# Patient Record
Sex: Male | Born: 1965 | Race: Black or African American | Hispanic: No | Marital: Single | Smoking: Never smoker
Health system: Southern US, Community
[De-identification: ages and names within clinical notes are randomized; demographics above are authoritative.]

## PROBLEM LIST (undated history)

## (undated) HISTORY — PX: LUNG SURGERY: SHX703

---

## 2009-02-01 ENCOUNTER — Emergency Department (HOSPITAL_COMMUNITY): Admission: EM | Admit: 2009-02-01 | Discharge: 2009-02-01 | Payer: Self-pay | Admitting: Emergency Medicine

## 2009-09-16 ENCOUNTER — Emergency Department (HOSPITAL_COMMUNITY): Admission: EM | Admit: 2009-09-16 | Discharge: 2009-09-16 | Payer: Self-pay | Admitting: Family Medicine

## 2010-07-11 ENCOUNTER — Emergency Department (HOSPITAL_COMMUNITY)
Admission: EM | Admit: 2010-07-11 | Discharge: 2010-07-11 | Disposition: A | Discharge status: Home / Self Care | Payer: Self-pay | Attending: Emergency Medicine | Admitting: Emergency Medicine

## 2010-07-11 DIAGNOSIS — R05 Cough: Secondary | ICD-10-CM | POA: Insufficient documentation

## 2010-07-11 DIAGNOSIS — R110 Nausea: Secondary | ICD-10-CM | POA: Insufficient documentation

## 2010-07-11 DIAGNOSIS — R000 Tachycardia, unspecified: Secondary | ICD-10-CM | POA: Insufficient documentation

## 2010-07-11 DIAGNOSIS — E669 Obesity, unspecified: Secondary | ICD-10-CM | POA: Insufficient documentation

## 2010-07-11 DIAGNOSIS — R059 Cough, unspecified: Secondary | ICD-10-CM | POA: Insufficient documentation

## 2010-07-11 DIAGNOSIS — R630 Anorexia: Secondary | ICD-10-CM | POA: Insufficient documentation

## 2010-07-11 DIAGNOSIS — R5381 Other malaise: Secondary | ICD-10-CM | POA: Insufficient documentation

## 2010-07-11 DIAGNOSIS — J069 Acute upper respiratory infection, unspecified: Secondary | ICD-10-CM | POA: Insufficient documentation

## 2010-07-11 DIAGNOSIS — IMO0001 Reserved for inherently not codable concepts without codable children: Secondary | ICD-10-CM | POA: Insufficient documentation

## 2010-07-11 DIAGNOSIS — J3489 Other specified disorders of nose and nasal sinuses: Secondary | ICD-10-CM | POA: Insufficient documentation

## 2011-05-11 ENCOUNTER — Encounter: Payer: Self-pay | Admitting: Emergency Medicine

## 2011-05-11 ENCOUNTER — Emergency Department (HOSPITAL_COMMUNITY)
Admission: EM | Admit: 2011-05-11 | Discharge: 2011-05-12 | Disposition: A | Discharge status: Home / Self Care | Payer: Self-pay | Attending: Emergency Medicine | Admitting: Emergency Medicine

## 2011-05-11 DIAGNOSIS — R110 Nausea: Secondary | ICD-10-CM | POA: Insufficient documentation

## 2011-05-11 DIAGNOSIS — R197 Diarrhea, unspecified: Secondary | ICD-10-CM | POA: Insufficient documentation

## 2011-05-11 DIAGNOSIS — B349 Viral infection, unspecified: Secondary | ICD-10-CM

## 2011-05-11 DIAGNOSIS — B9789 Other viral agents as the cause of diseases classified elsewhere: Secondary | ICD-10-CM | POA: Insufficient documentation

## 2011-05-11 DIAGNOSIS — R509 Fever, unspecified: Secondary | ICD-10-CM | POA: Insufficient documentation

## 2011-05-11 MED ORDER — ONDANSETRON HCL 4 MG/2ML IJ SOLN
4.0000 mg | Freq: Once | INTRAMUSCULAR | Status: AC
  Administered 2011-05-12: 4 mg via INTRAVENOUS
  Filled 2011-05-11: qty 2

## 2011-05-11 MED ORDER — SODIUM CHLORIDE 0.9 % IV BOLUS (SEPSIS)
1000.0000 mL | Freq: Once | INTRAVENOUS | Status: AC
  Administered 2011-05-12: 1000 mL via INTRAVENOUS

## 2011-05-11 MED ORDER — ACETAMINOPHEN 325 MG PO TABS
650.0000 mg | ORAL_TABLET | Freq: Once | ORAL | Status: AC
  Administered 2011-05-11: 650 mg via ORAL
  Filled 2011-05-11: qty 2

## 2011-05-11 NOTE — ED Notes (Signed)
PT. REPORTS DIARRHEA WITH NAUSEA ONSET THIS MORNING , BODY ACHES WITH CHILLS .

## 2011-05-12 LAB — POCT I-STAT, CHEM 8
Creatinine, Ser: 1.2 mg/dL (ref 0.50–1.35)
Glucose, Bld: 107 mg/dL — ABNORMAL HIGH (ref 70–99)
Hemoglobin: 13.6 g/dL (ref 13.0–17.0)
Potassium: 3.6 mEq/L (ref 3.5–5.1)
TCO2: 24 mmol/L (ref 0–100)

## 2011-05-12 LAB — CBC
HCT: 37.7 % — ABNORMAL LOW (ref 39.0–52.0)
Hemoglobin: 12.9 g/dL — ABNORMAL LOW (ref 13.0–17.0)
MCH: 27.6 pg (ref 26.0–34.0)
MCHC: 34.2 g/dL (ref 30.0–36.0)

## 2011-05-12 MED ORDER — ONDANSETRON HCL 4 MG PO TABS: 4.0000 mg | ORAL_TABLET | Freq: Four times a day (QID) | ORAL | Status: AC

## 2011-05-12 NOTE — ED Notes (Signed)
Pt tolerated ginger ale, states he feels better, no needs at this time. States he is ready to be discharged

## 2011-05-12 NOTE — ED Provider Notes (Signed)
History     CSN: [id number]  Arrival date & time 05/11/11  2040   First MD Initiated Contact with Patient 05/11/11 2348      Chief Complaint  Patient presents with  . Diarrhea    (Consider location/radiation/quality/duration/timing/severity/associated sxs/prior treatment) Patient is a 46 y.o. male presenting with diarrhea. The history is provided by the patient.  Diarrhea The primary symptoms include fever and diarrhea. Primary symptoms do not include abdominal pain, dysuria or rash. The illness began yesterday. The onset was gradual. The problem has been gradually worsening.  The illness does not include chills, bloating, constipation or back pain. Associated medical issues do not include inflammatory bowel disease, GERD, gallstones, alcohol abuse, PUD, irritable bowel syndrome or diverticulitis. Risk factors: Recent travel, antibiotics or known sick contacts.   Moderate severity. No pain or radiation. No known aggravating or alleviating factors. 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  No sick contacts at home or in the workplace. He denies getting a flu shot this season. No blood in stools. No vomiting. He does have some associated nausea.  History reviewed. No pertinent past medical history.  Past Surgical History  Procedure Date  . Lung surgery     No family history on file.  History  Substance Use Topics  . Smoking status: Never Smoker   . Smokeless tobacco: Not on file  . Alcohol Use: No      Review of Systems  Constitutional: Positive for fever. Negative for chills.  HENT: Negative for neck pain and neck stiffness.   Eyes: Negative for pain.  Respiratory: Negative for shortness of breath.   Cardiovascular: Negative for chest pain.  Gastrointestinal: Positive for diarrhea. Negative for abdominal pain, constipation and bloating.  Genitourinary: Negative for dysuria.  Musculoskeletal: Negative for back pain.  Skin: Negative for rash.  Neurological: Negative for  headaches.  All other systems reviewed and are negative.    Allergies  Review of patient's allergies indicates no known allergies.  Home Medications   Current Outpatient Rx  Name Route Sig Dispense Refill  . TYLENOL ARTHRITIS PAIN PO Oral Take 1 tablet by mouth every 4 (four) hours as needed. For pain     . ADULT MULTIVITAMIN W/MINERALS CH Oral Take 1 tablet by mouth daily.        BP 141/79  Pulse 101  Temp(Src) 100.8 F (38.2 C) (Oral)  Resp 16  SpO2 95%  Physical Exam  Constitutional: He is oriented to person, place, and time. He appears well-developed and well-nourished.  HENT:  Head: Normocephalic and atraumatic.       Mildly dry mucous membranes  Eyes: Conjunctivae and EOM are normal. Pupils are equal, round, and reactive to light.  Neck: Trachea normal. Neck supple. No thyromegaly present.  Cardiovascular: Normal rate, regular rhythm, S1 normal, S2 normal and normal pulses.     No systolic murmur is present   No diastolic murmur is present  Pulses:      Radial pulses are 2+ on the right side, and 2+ on the left side.  Pulmonary/Chest: Effort normal and breath sounds normal. He has no wheezes. He has no rhonchi. He has no rales. He exhibits no tenderness.  Abdominal: Soft. Normal appearance and bowel sounds are normal. There is no tenderness. There is no CVA tenderness and negative Murphy's sign.  Musculoskeletal:       BLE:s Calves nontender, no cords or erythema, negative Homans sign  Neurological: He is alert and oriented to person, place, and  time. He has normal strength. No cranial nerve deficit or sensory deficit. GCS eye subscore is 4. GCS verbal subscore is 5. GCS motor subscore is 6.  Skin: Skin is warm. No rash noted. He is not diaphoretic.       Increased warmth to touch  Psychiatric: His speech is normal.       Cooperative and appropriate    ED Course  Procedures (including critical care time)  Labs Reviewed  CBC - Abnormal; Notable for the  following:    Hemoglobin 12.9 (*)    HCT 37.7 (*)    All other components within normal limits  POCT I-STAT, CHEM 8 - Abnormal; Notable for the following:    Glucose, Bld 107 (*)    Calcium, Ion 1.11 (*)    All other components within normal limits  I-STAT, CHEM 8   IV Zofran. IV fluids. Tolerates ginger ale without distress   MDM   Fever and diarrhea consistent with viral syndrome. Labs obtained and reviewed as above. Tylenol for fever. Condition improving after IV fluids and medications as above. Patient stable for discharge home and outpatient followup as needed.        Sunnie Nielsen, MD 05/12/11 [phone]

## 2014-08-21 ENCOUNTER — Emergency Department (HOSPITAL_COMMUNITY): Payer: Self-pay

## 2014-08-21 ENCOUNTER — Encounter (HOSPITAL_COMMUNITY): Payer: Self-pay | Admitting: Emergency Medicine

## 2014-08-21 ENCOUNTER — Emergency Department (HOSPITAL_COMMUNITY)
Admission: EM | Admit: 2014-08-21 | Discharge: 2014-08-21 | Disposition: A | Discharge status: Home / Self Care | Payer: Self-pay | Attending: Emergency Medicine | Admitting: Emergency Medicine

## 2014-08-21 DIAGNOSIS — J159 Unspecified bacterial pneumonia: Secondary | ICD-10-CM | POA: Insufficient documentation

## 2014-08-21 DIAGNOSIS — R0981 Nasal congestion: Secondary | ICD-10-CM

## 2014-08-21 DIAGNOSIS — J189 Pneumonia, unspecified organism: Secondary | ICD-10-CM

## 2014-08-21 LAB — CBC WITH DIFFERENTIAL/PLATELET
BASOS PCT: 0 % (ref 0–1)
Basophils Absolute: 0 10*3/uL (ref 0.0–0.1)
EOS ABS: 0.4 10*3/uL (ref 0.0–0.7)
EOS PCT: 6 % — AB (ref 0–5)
HCT: 37.7 % — ABNORMAL LOW (ref 39.0–52.0)
HEMOGLOBIN: 12.6 g/dL — AB (ref 13.0–17.0)
LYMPHS ABS: 1 10*3/uL (ref 0.7–4.0)
Lymphocytes Relative: 17 % (ref 12–46)
MCH: 26.6 pg (ref 26.0–34.0)
MCHC: 33.4 g/dL (ref 30.0–36.0)
MCV: 79.7 fL (ref 78.0–100.0)
MONOS PCT: 8 % (ref 3–12)
Monocytes Absolute: 0.5 10*3/uL (ref 0.1–1.0)
NEUTROS PCT: 69 % (ref 43–77)
Neutro Abs: 4 10*3/uL (ref 1.7–7.7)
Platelets: 244 10*3/uL (ref 150–400)
RBC: 4.73 MIL/uL (ref 4.22–5.81)
RDW: 12.7 % (ref 11.5–15.5)
WBC: 5.9 10*3/uL (ref 4.0–10.5)

## 2014-08-21 LAB — COMPREHENSIVE METABOLIC PANEL
ALT: 31 U/L (ref 0–53)
AST: 35 U/L (ref 0–37)
Albumin: 3.8 g/dL (ref 3.5–5.2)
Alkaline Phosphatase: 67 U/L (ref 39–117)
Anion gap: 9 (ref 5–15)
BILIRUBIN TOTAL: 1.2 mg/dL (ref 0.3–1.2)
BUN: 11 mg/dL (ref 6–23)
CHLORIDE: 104 mmol/L (ref 96–112)
CO2: 25 mmol/L (ref 19–32)
Calcium: 9 mg/dL (ref 8.4–10.5)
Creatinine, Ser: 1.1 mg/dL (ref 0.50–1.35)
GFR, EST AFRICAN AMERICAN: 89 mL/min — AB (ref >90)
GFR, EST NON AFRICAN AMERICAN: 77 mL/min — AB (ref >90)
GLUCOSE: 102 mg/dL — AB (ref 70–99)
Potassium: 4.3 mmol/L (ref 3.5–5.1)
SODIUM: 138 mmol/L (ref 135–145)
Total Protein: 8.1 g/dL (ref 6.0–8.3)

## 2014-08-21 LAB — BRAIN NATRIURETIC PEPTIDE: B Natriuretic Peptide: 27.9 pg/mL (ref 0.0–100.0)

## 2014-08-21 LAB — I-STAT TROPONIN, ED: Troponin i, poc: 0 ng/mL (ref 0.00–0.08)

## 2014-08-21 MED ORDER — AMOXICILLIN-POT CLAVULANATE 875-125 MG PO TABS: 1.0000 | ORAL_TABLET | Freq: Two times a day (BID) | ORAL | Status: DC

## 2014-08-21 MED ORDER — FLUTICASONE PROPIONATE 50 MCG/ACT NA SUSP: 2.0000 | Freq: Every day | NASAL | Status: DC

## 2014-08-21 MED ORDER — LORATADINE 10 MG PO TABS: 10.0000 mg | ORAL_TABLET | Freq: Every day | ORAL | Status: DC | PRN

## 2014-08-21 MED ORDER — LEVOFLOXACIN 500 MG PO TABS
500.0000 mg | ORAL_TABLET | Freq: Once | ORAL | Status: DC
  Filled 2014-08-21: qty 1

## 2014-08-21 MED ORDER — AMOXICILLIN-POT CLAVULANATE 875-125 MG PO TABS
1.0000 | ORAL_TABLET | Freq: Once | ORAL | Status: AC
  Administered 2014-08-21: 1 via ORAL
  Filled 2014-08-21: qty 1

## 2014-08-21 NOTE — Discharge Instructions (Signed)
Try claritin daily in addition to benadryl as needed.   Try flonase daily to both nose.  Take augmentin twice daily for a week.   Stay hydrated.   Follow up with your doctor.   Return to ER if you have worse congestion, fever, chest pain.

## 2014-08-21 NOTE — ED Provider Notes (Signed)
MSE was initiated and I personally evaluated the patient and placed orders (if any) at  7:55 AM on August 21, 2014.  Eric Medina with past medical history of lung surgery presenting to the ED with nasal congestion that has been ongoing for approximately 2 days. Patient reports he feels mild pressure within his maxillary sinuses and feels congested. Patient reported that he has been having a mild cough only at night starting yesterday with a white phlegm. Reported that for the past 3-4 days he's been experiencing intermittent chest pain localized left-sided the chest described as a sharp pain without radiation. Reported that the pain is not related with motion. Denied history of diabetes or hypertension. Denied history of cigarette use. Reported that he's been using [facility] that has been helping his congestion, but not fully. Patient reported that mother has history of hypertension and diabetes as well as numerous heart conditions. Patient reports that he does not follow-up with primary care provider and has not followed up in many years. Denied fever, chills, neck pain, neck stiffness, dizziness, shortness of breath and difficulty breathing, hemoptysis, numbness, tingling, blurred vision, sudden loss of vision, weakness, headache, sore throat, difficulty swallowing, bump pain, travel, leg swelling, nausea, vomiting.  Alert and oriented. GCS 15. Heart rate and rhythm normal. Radial pulses 2+ bilaterally. Cap refill less than 3 seconds. Lungs clear to auscultation to upper and lower lobes bilaterally. Patient is able to speak in full sentences without difficulty, negative use of accessory muscles, negative stridor, negative abdominal retractions. Negative pain upon palpation to the chest wall. Neck supple full range of motion. Negative nuchal rigidity, cervical lymphadenopathy or meningeal signs noted on examination. Negative facial swelling.  Tenderness upon palpation to maxillary sinuses. PERRLA with EOMs intact. Ears noted to have mild congestion with negative findings of infection.  Patient is a 49 year old Medina that does not follow-up with primary care provider. Strong family history of cardiac issues. Patient is pressing chest pain has been ongoing intermittently for 3-4 days. Patient will need further workup. Patient moved to main ED for further assessment. Labs and imaging orders have been placed.  The patient appears stable so that the remainder of the MSE may be completed by another provider.  [name], PA-C 08/21/14 0759  [name], MD 08/21/14 [phone]

## 2014-08-21 NOTE — ED Notes (Signed)
Patient states started having nasal congestion x 2 days ago.  Patient states took an "allergy relief from [facility]".  Patient states it did help, but wanted to come check it out.   Patient states not blowing anything out, but feels congestion.  Patient states he does have a productive cough, but "only cough once an hour or so".

## 2014-08-21 NOTE — ED Notes (Signed)
No mention of chest pain to RN, but mentioned L chest paint to PA during examination.

## 2014-08-21 NOTE — ED Notes (Signed)
NAD at this time. Pt ambulated independently to exit with this RN.

## 2014-08-21 NOTE — ED Provider Notes (Signed)
CSN: [id number]     Arrival date & time 08/21/14  [id number] History   First MD Initiated Contact with Patient 08/21/14 [phone]     Chief Complaint  Patient presents with  . Nasal Congestion  . Chest Pain     (Consider location/radiation/quality/duration/timing/severity/associated sxs/prior Treatment) The history is provided by the patient.  Eric GowdaKenneth L Medina is a 49 y.o. male who presented with congestion, left-sided chest pain, cough. He has been having nasal congestion for the last 3-4 days. Also has some nonproductive cough and some pain when he coughs. Denies any fevers or chills. He states that he gets allergies during this season. Has been taking Benadryl with no relief. Denies any shortness of breath. Denies any recent travels.    History reviewed. No pertinent past medical history. Past Surgical History  Procedure Laterality Date  . Lung surgery     No family history on file. History  Substance Use Topics  . Smoking status: Never Smoker   . Smokeless tobacco: Not on file  . Alcohol Use: No    Review of Systems  HENT: Positive for congestion.   All other systems reviewed and are negative.     Allergies  Review of patient's allergies indicates no known allergies.  Home Medications   Prior to Admission medications   Medication Sig Start Date End Date Taking? Authorizing Provider  diphenhydrAMINE (BENADRYL) 25 MG tablet Take 25 mg by mouth at bedtime as needed for itching or allergies.   Yes Historical Provider, MD  Multiple Vitamin (MULITIVITAMIN WITH MINERALS) TABS Take 1 tablet by mouth daily.     Yes Historical Provider, MD  OVER THE COUNTER MEDICATION Apply 1 application topically daily as needed (itching on arms). OTC itching/rash cream   Yes Historical Provider, MD   BP 136/84 mmHg  Pulse 88  Temp(Src) 98.5 F (36.9 C) (Oral)  Resp 26  Ht 6\' 3"  (1.905 m)  Wt 258 lb (117.028 kg)  BMI 32.25 kg/m2  SpO2 100% Physical Exam  Constitutional: He is oriented to  person, place, and time. He appears well-developed and well-nourished.  HENT:  Head: Normocephalic.  Mouth/Throat: Oropharynx is clear and moist.  No obvious sinus tenderness   Eyes: Conjunctivae are normal. Pupils are equal, round, and reactive to light.  Neck: Normal range of motion. Neck supple.  Cardiovascular: Normal rate, regular rhythm and normal heart sounds.   Pulmonary/Chest: Effort normal and breath sounds normal. No respiratory distress. He has no wheezes. He has no rales. He exhibits no tenderness.  Abdominal: Soft. Bowel sounds are normal. He exhibits no distension. There is no tenderness. There is no rebound.  Musculoskeletal: Normal range of motion. He exhibits no edema or tenderness.  Neurological: He is alert and oriented to person, place, and time. No cranial nerve deficit. Coordination normal.  Skin: Skin is warm and dry.  Psychiatric: He has a normal mood and affect. His behavior is normal. Judgment and thought content normal.  Nursing note and vitals reviewed.   ED Course  Procedures (including critical care time) Labs Review Labs Reviewed  CBC WITH DIFFERENTIAL/PLATELET - Abnormal; Notable for the following:    Hemoglobin 12.6 (*)    HCT 37.7 (*)    Eosinophils Relative 6 (*)    All other components within normal limits  COMPREHENSIVE METABOLIC PANEL - Abnormal; Notable for the following:    Glucose, Bld 102 (*)    GFR calc non Af Amer 77 (*)    GFR calc Af Amer 89 (*)  All other components within normal limits  BRAIN NATRIURETIC PEPTIDE  I-STAT TROPOININ, ED    Imaging Review Dg Chest 2 View  08/21/2014   CLINICAL DATA:  Productive cough and chest congestion. Chest pain with cough.  EXAM: CHEST  2 VIEW  COMPARISON:  02/01/2009  FINDINGS: There is new lateral pleural thickening in the left hemi thorax with retraction of the adjacent lung cord the pleural surface. No appreciable underlying osseous abnormality. Heart size and vascularity are normal. Right  lung is clear. No osseous abnormality.  IMPRESSION: New lateral pleural thickening in the left hemi thorax with lung retraction toward the pleura. Has the patient had interval pneumonia?   Electronically Signed   By: Francene Boyers M.D.   On: 08/21/2014 08:36     EKG Interpretation   Date/Time:  Thursday August 21 2014 08:02:22 EDT Ventricular Rate:  95 PR Interval:  158 QRS Duration: 74 QT Interval:  346 QTC Calculation: 435 R Axis:   53 Text Interpretation:  Sinus rhythm Minimal ST elevation, anterior leads  Baseline wander in lead(s) II III aVL aVF V1 V3 V4 V5 V6 No significant  change since last tracing Confirmed by Titiana Severa  MD, Ilene Witcher ([id number]) on 08/21/2014  8:59:45 AM      MDM   Final diagnoses:  None    Eric Medina is a 49 y.o. male here with sinus congestion, cough. Likely allergies. Will get CXR to r/o pneumonia. I doubt ACS and symptoms for several days so trop x 1 sufficient.   9:29 AM cxr showed pneumonia. WBC nl. Doesn't appear septic. Labs otherwise unremarkable. Doesn't have great insurance. Will try augmentin. Will give flonase and claritin for nasal congestion.    Richardean Canal, MD 08/21/14 0930

## 2015-06-10 ENCOUNTER — Encounter (HOSPITAL_COMMUNITY): Payer: Self-pay | Admitting: Emergency Medicine

## 2015-06-10 ENCOUNTER — Other Ambulatory Visit (HOSPITAL_COMMUNITY)
Admission: RE | Admit: 2015-06-10 | Discharge: 2015-06-10 | Disposition: A | Discharge status: Home / Self Care | Payer: Self-pay | Source: Ambulatory Visit | Attending: Family Medicine | Admitting: Family Medicine

## 2015-06-10 ENCOUNTER — Emergency Department (INDEPENDENT_AMBULATORY_CARE_PROVIDER_SITE_OTHER)
Admission: EM | Admit: 2015-06-10 | Discharge: 2015-06-10 | Disposition: A | Discharge status: Home / Self Care | Payer: Self-pay | Source: Home / Self Care | Attending: Family Medicine | Admitting: Family Medicine

## 2015-06-10 DIAGNOSIS — R369 Urethral discharge, unspecified: Secondary | ICD-10-CM

## 2015-06-10 DIAGNOSIS — N5312 Painful ejaculation: Secondary | ICD-10-CM

## 2015-06-10 DIAGNOSIS — Z113 Encounter for screening for infections with a predominantly sexual mode of transmission: Secondary | ICD-10-CM | POA: Insufficient documentation

## 2015-06-10 MED ORDER — CEFTRIAXONE SODIUM 250 MG IJ SOLR
INTRAMUSCULAR | Status: AC
  Filled 2015-06-10: qty 250

## 2015-06-10 MED ORDER — LIDOCAINE HCL (PF) 1 % IJ SOLN
INTRAMUSCULAR | Status: AC
  Filled 2015-06-10: qty 5

## 2015-06-10 MED ORDER — CEFTRIAXONE SODIUM 250 MG IJ SOLR
250.0000 mg | Freq: Once | INTRAMUSCULAR | Status: AC
  Administered 2015-06-10: 250 mg via INTRAMUSCULAR

## 2015-06-10 MED ORDER — AZITHROMYCIN 250 MG PO TABS
1000.0000 mg | ORAL_TABLET | Freq: Once | ORAL | Status: AC
  Administered 2015-06-10: 1000 mg via ORAL

## 2015-06-10 NOTE — ED Provider Notes (Signed)
CSN: [id number]     Arrival date & time 06/10/15  1828 History   First MD Initiated Contact with Patient 06/10/15 2014     Chief Complaint  Patient presents with  . Urinary Tract Infection   (Consider location/radiation/quality/duration/timing/severity/associated sxs/prior Treatment) HPI Comments: 50 year old male complaining with pain and burning with ejaculation started 2 days ago. Also has a penile discharge. He denies dysuria or testicular pain.  Patient is a 50 y.o. male presenting with urinary tract infection.  Urinary Tract Infection    History reviewed. No pertinent past medical history. Past Surgical History  Procedure Laterality Date  . Lung surgery     No family history on file. Social History  Substance Use Topics  . Smoking status: Never Smoker   . Smokeless tobacco: None  . Alcohol Use: No    Review of Systems  Constitutional: Negative.  Negative for fever.  Genitourinary: Positive for discharge and penile pain. Negative for dysuria, urgency, frequency, decreased urine volume, penile swelling, scrotal swelling, genital sores and testicular pain.  Musculoskeletal: Negative.   Skin: Negative.   Neurological: Negative.     Allergies  Review of patient's allergies indicates no known allergies.  Home Medications   Prior to Admission medications   Medication Sig Start Date End Date Taking? Authorizing Provider  amoxicillin-clavulanate (AUGMENTIN) 875-125 MG per tablet Take 1 tablet by mouth 2 (two) times daily. One po bid x 7 days Patient not taking: Reported on 06/10/2015 08/21/14   Richardean Canal, MD  diphenhydrAMINE (BENADRYL) 25 MG tablet Take 25 mg by mouth at bedtime as needed for itching or allergies.    Historical Provider, MD  fluticasone (FLONASE) 50 MCG/ACT nasal spray Place 2 sprays into both nostrils daily. 08/21/14   Richardean Canal, MD  loratadine (CLARITIN) 10 MG tablet Take 1 tablet (10 mg total) by mouth daily as needed for allergies. 08/21/14   Richardean Canal, MD  Multiple Vitamin (MULITIVITAMIN WITH MINERALS) TABS Take 1 tablet by mouth daily.      Historical Provider, MD  OVER THE COUNTER MEDICATION Apply 1 application topically daily as needed (itching on arms). OTC itching/rash cream    Historical Provider, MD   Meds Ordered and Administered this Visit   Medications  cefTRIAXone (ROCEPHIN) injection 250 mg (not administered)  azithromycin (ZITHROMAX) tablet 1,000 mg (not administered)    BP 146/96 mmHg  Pulse 77  Temp(Src) 98 F (36.7 C) (Oral)  Resp 18  SpO2 100% No data found.   Physical Exam  Constitutional: He appears well-developed and well-nourished. No distress.  Neck: Normal range of motion. Neck supple.  Cardiovascular: Normal rate.   Pulmonary/Chest: Effort normal.  Musculoskeletal: He exhibits no edema.  Neurological: He is alert. No cranial nerve deficit.  Skin: Skin is warm and dry.  Psychiatric: He has a normal mood and affect.  Nursing note and vitals reviewed.   ED Course  Procedures (including critical care time)  Labs Review Labs Reviewed  URINE CYTOLOGY ANCILLARY ONLY    Imaging Review No results found.   Visual Acuity Review  Right Eye Distance:   Left Eye Distance:   Bilateral Distance:    Right Eye Near:   Left Eye Near:    Bilateral Near:         MDM   1. Abnormal penile discharge   2. Pain with ejaculation    Meds ordered this encounter  Medications  . cefTRIAXone (ROCEPHIN) injection 250 mg    Sig:   .  azithromycin (ZITHROMAX) tablet 1,000 mg    Sig:    Urine cytology pending    Hayden Rasmussen, NP 06/10/15 2031

## 2015-06-10 NOTE — ED Notes (Signed)
Patient not ready for discharge, post injection delay 

## 2015-06-10 NOTE — Discharge Instructions (Signed)
Dyspareunia, Male Dyspareunia is pain that is associated with sexual activity. This can affect any part of the genitals or lower abdomen, and there are many possible causes. This condition ranges from mild to severe. Depending on the cause, dyspareunia may get better with treatment, or it may return (recur) over time. CAUSES The cause of this condition is not always known. Possible causes include:  Cancer.  Psychological factors, such as depression, anxiety, or previous traumatic experiences.  Infection in any of the following body parts:  The prostate.  The bladder.  The seminal vesicles.  The skin of the penis or scrotum.  An inflamed bladder (interstitial cystitis).  Gonorrhea.  An inflamed urethra (urethritis).  An inflamed prostate (prostatitis).  Deformities of the penis.  A tight foreskin. RISK FACTORS The following factors may make you more likely to develop this condition:  Having experienced physical or sexual trauma.  Older age. SYMPTOMS The main symptom of this condition is pain in any part of the genitals or lower abdomen during or after sexual activity. This may include pain during sexual arousal, genital stimulation, or ejaculation. Pain may get worse when the genitals are touched in any way, such as when sitting or wearing pants. Pain can range from mild to severe, depending on the cause of the condition. In some cases, symptoms go away with treatment and recur at a later date. DIAGNOSIS This condition may be diagnosed based on:  Your symptoms, including:  Where your pain is located.  When your pain occurs.  Your medical history.  A physical exam. This may include a prostate exam.  Tests, including:  Blood tests.  Ultrasound. This uses sound waves to make a picture of the area that is being tested.  Urine culture. This test involves checking a urine sample for signs of infection.  X-rays.  MRI.  CT scan. You may be referred to a health  care provider who specializes in [facility] (urologist). In some cases, diagnosing the cause of dyspareunia can be difficult. TREATMENT Treatment depends on the cause of your condition and your symptoms. In most cases, you may need to stop sexual activity until your symptoms improve. Treatment may include:  Lubricants.  Medicated skin creams.  Antibiotic medicine to prevent or fight infection.  Medicines that help to relieve pain.  Medicines that treat depression (antidepressants).  Psychological counseling.  Sex therapy.  Surgery. HOME CARE INSTRUCTIONS Lifestyle  Avoid tight clothing and irritating materials around your genital and abdominal area.  Use water-based lubricants as needed. Avoid oil-based lubricants.  Do not use any products that irritate you. This may include certain condoms, spermicides, lubricants, or soaps.  Always practice safe sex. Talk with your health care provider about which form of birth control (contraception) is best for you.  Maintain open communication with your sexual partner. General Instructions  Take over-the-counter and prescription medicines only as told by your health care provider.  If you had tests done, it is your responsibility to get your tests results. Ask your health care provider or the department performing the test when your results will be ready.  Urinate before you engage in sexual activity.  Consider joining a support group.  Keep all follow-up visits as told by your health care provider. This is important. SEEK MEDICAL CARE IF:  You have:  A new rash.  Symptoms that get worse or do not improve with treatment.  A fever.  Pain when you urinate.  Blood in your urine. SEEK IMMEDIATE MEDICAL CARE IF:  You pass out after having sexual intercourse.   This information is not intended to replace advice given to you by your health care provider. Make sure you discuss any questions you have with your health care  provider.   Document Released: 01/14/2015 Document Reviewed: 11/25/2014 Elsevier Interactive Patient Education [employer]

## 2015-06-10 NOTE — ED Notes (Signed)
Patient concerned for infection

## 2015-06-11 LAB — URINE CYTOLOGY ANCILLARY ONLY
Chlamydia: NEGATIVE
NEISSERIA GONORRHEA: NEGATIVE
TRICH (WINDOWPATH): NEGATIVE

## 2015-06-20 ENCOUNTER — Telehealth (HOSPITAL_COMMUNITY): Payer: Self-pay | Admitting: Emergency Medicine

## 2015-06-20 NOTE — ED Notes (Signed)
LM on pt's VM [phone] Need to give lab results from recent visit on 2/1  Per Dr. Dayton Scrape,  Please let patient know that chlamydia/gonorrhea/trichomonas tests were negative  Will try later.

## 2015-06-29 NOTE — ED Notes (Signed)
Called pt and notified of recent lab results from visit 2/1 Pt ID'd properly... Reports feeling better and sx have subsided  Per Dr. Dayton Scrape,  Please let patient know that chlamydia/gonorrhea/trichomonas tests were negative. LM  Adv pt if sx are not getting better to return  Education on safe sex given Pt verb understanding.

## 2015-10-25 IMAGING — DX DG CHEST 2V
2 series · 2 of 2 positions shown · non-contrast
Comparison: 02/01/2009

CLINICAL DATA: Productive cough and chest congestion. Chest pain
with cough.

EXAM:
CHEST  2 VIEW

[chest pa]
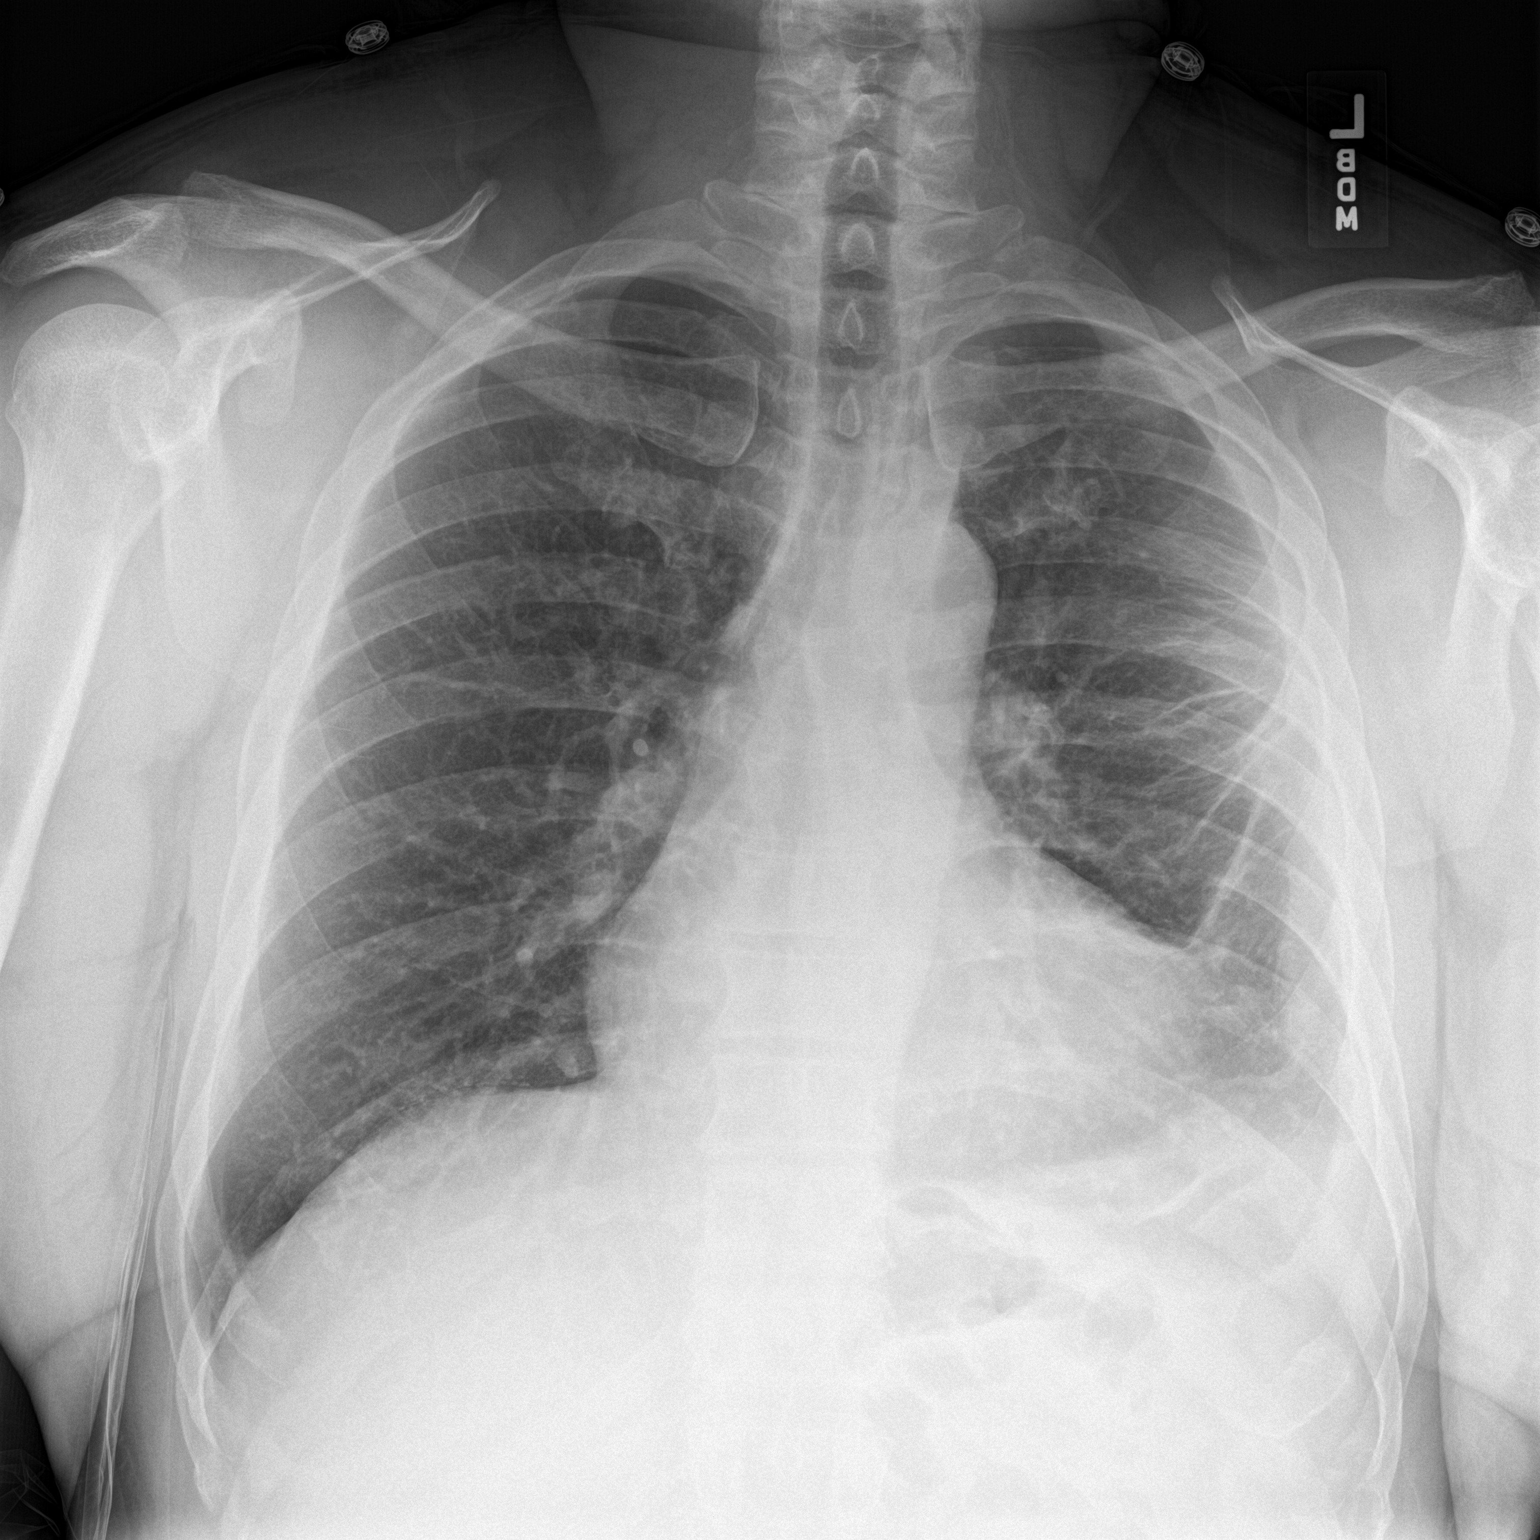

[chest lat]
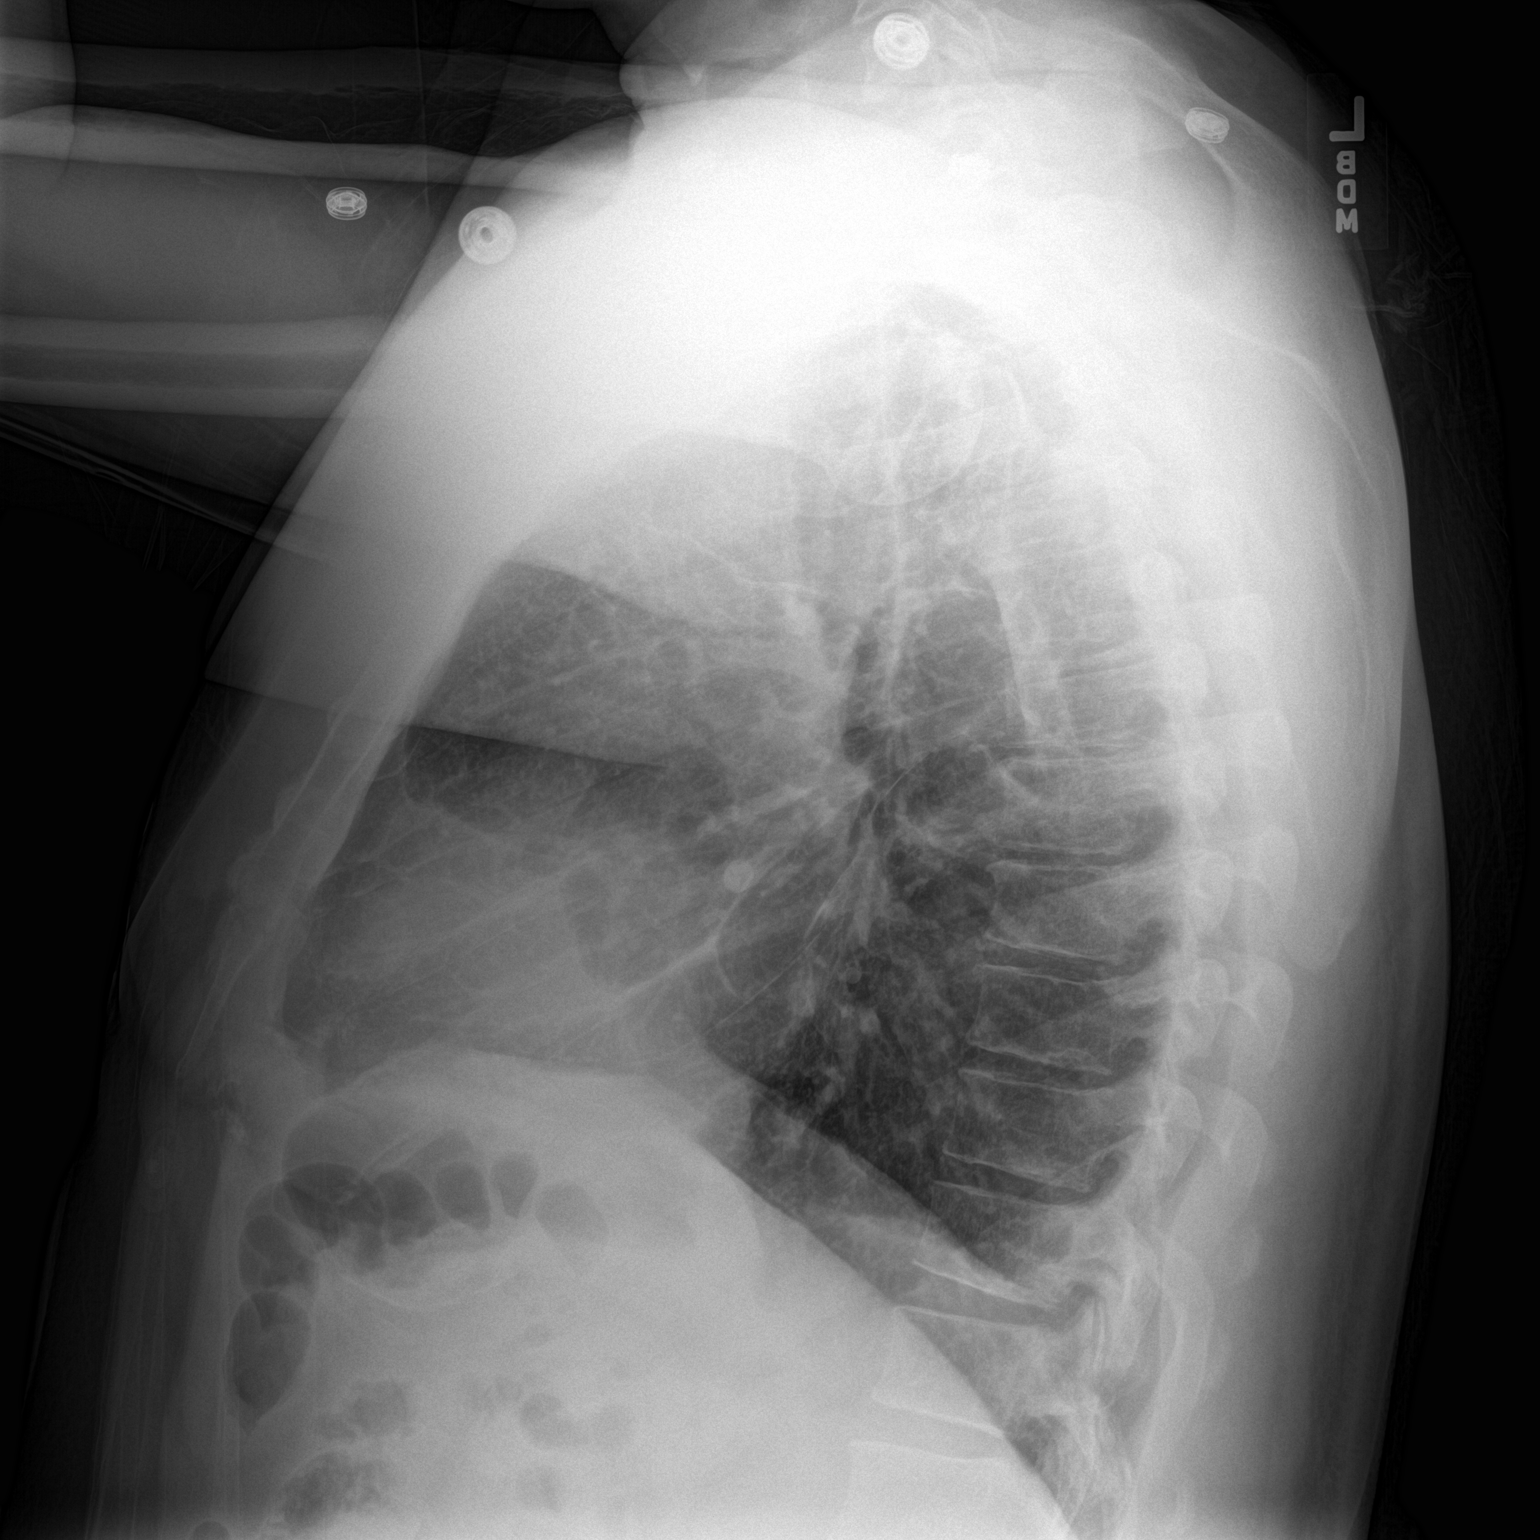

[2 of 2 positions shown; findings below may reference images not displayed]

FINDINGS: There is new lateral pleural thickening in the left hemi thorax with
retraction of the adjacent lung cord the pleural surface. No
appreciable underlying osseous abnormality. Heart size and
vascularity are normal. Right lung is clear. No osseous abnormality.
IMPRESSION: New lateral pleural thickening in the left hemi thorax with lung
retraction toward the pleura. Has the patient had interval
pneumonia?

## 2016-01-01 ENCOUNTER — Encounter (HOSPITAL_COMMUNITY): Payer: Self-pay | Admitting: Emergency Medicine

## 2016-01-01 ENCOUNTER — Ambulatory Visit (INDEPENDENT_AMBULATORY_CARE_PROVIDER_SITE_OTHER): Payer: Self-pay

## 2016-01-01 ENCOUNTER — Ambulatory Visit (HOSPITAL_COMMUNITY)
Admission: EM | Admit: 2016-01-01 | Discharge: 2016-01-01 | Disposition: A | Discharge status: Home / Self Care | Payer: Self-pay | Attending: Family Medicine | Admitting: Family Medicine

## 2016-01-01 DIAGNOSIS — M84443S Pathological fracture, unspecified hand, sequela: Secondary | ICD-10-CM

## 2016-01-01 DIAGNOSIS — M25532 Pain in left wrist: Secondary | ICD-10-CM

## 2016-01-01 MED ORDER — NAPROXEN 500 MG PO TABS: 500.0000 mg | ORAL_TABLET | Freq: Two times a day (BID) | ORAL | 0 refills | Status: DC

## 2016-01-01 NOTE — ED Triage Notes (Signed)
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 Has some swelling as well  A&O x4... NAD

## 2016-01-01 NOTE — ED Provider Notes (Signed)
CSN: [id number]     Arrival date & time 01/01/16  1201 History   First MD Initiated Contact with Patient 01/01/16 1316     Chief Complaint  Patient presents with  . 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   The history is provided by the patient.  Wrist Pain  This is a new problem. The current episode started more than 1 week ago. The problem occurs constantly. The problem has not changed since onset.The symptoms are aggravated by exertion. The symptoms are relieved by rest. He has tried nothing for the symptoms.    History reviewed. No pertinent past medical history. Past Surgical History:  Procedure Laterality Date  . LUNG SURGERY     History reviewed. No pertinent family history. Social History  Substance Use Topics  . Smoking status: Never Smoker  . Smokeless tobacco: Never Used  . Alcohol use No    Review of Systems  Constitutional: Negative.   HENT: Negative.   Eyes: Negative.   Respiratory: Negative.   Cardiovascular: Negative.   Gastrointestinal: Negative.   Endocrine: Negative.   Genitourinary: Negative.   Musculoskeletal: Positive for joint swelling.  Skin: Negative.   Allergic/Immunologic: Negative.   Neurological: Negative.   Hematological: Negative.   Psychiatric/Behavioral: Negative.     Allergies  Review of patient's allergies indicates no known allergies.  Home Medications   Prior to Admission medications   Medication Sig Start Date End Date Taking? Authorizing Provider  amoxicillin-clavulanate (AUGMENTIN) 875-125 MG per tablet Take 1 tablet by mouth 2 (two) times daily. One po bid x 7 days Patient not taking: Reported on 06/10/2015 08/21/14   Charlynne Pander, MD  diphenhydrAMINE (BENADRYL) 25 MG tablet Take 25 mg by  mouth at bedtime as needed for itching or allergies.    Historical Provider, MD  fluticasone (FLONASE) 50 MCG/ACT nasal spray Place 2 sprays into both nostrils daily. 08/21/14   Charlynne Pander, MD  loratadine (CLARITIN) 10 MG tablet Take 1 tablet (10 mg total) by mouth daily as needed for allergies. 08/21/14   Charlynne Pander, MD  Multiple Vitamin (MULITIVITAMIN WITH MINERALS) TABS Take 1 tablet by mouth daily.      Historical Provider, MD  OVER THE COUNTER MEDICATION Apply 1 application topically daily as needed (itching on arms). OTC itching/rash cream    Historical Provider, MD   Meds Ordered and Administered this Visit  Medications - No data to display  BP 116/68   Pulse 84   Temp 98.8 F (37.1 C) (Oral)   Resp 12   SpO2 98%  No data found.   Physical Exam  Constitutional: He appears well-developed and well-nourished.  HENT:  Head: Normocephalic and atraumatic.  Eyes: EOM are normal. Pupils are equal, round, and reactive to light.  Neck: Normal range of motion. Neck supple.  Cardiovascular: Normal rate, regular rhythm and normal heart sounds.   Pulmonary/Chest: Effort normal and breath sounds normal.  Musculoskeletal: He exhibits tenderness.  Left wrist swollen and tender and decreased ROM  Nursing note and vitals reviewed.   Urgent Care Course   Clinical Course    Procedures (including critical care time)  Labs Review Labs Reviewed - No data to display  Imaging Review Dg Wrist Complete Left  Result Date: 01/01/2016 CLINICAL DATA:  Patient states that he was carrying heavy pipe at work and started having pain 5 days ago in his left wrist EXAM: LEFT WRIST - COMPLETE 3+ VIEW COMPARISON:  None. FINDINGS: There is an apparent fracture at the ulnar base of the fifth metacarpal, nondisplaced. This could be a chronic finding, but if it correlates with the site of pain, should be considered likely acute. No other evidence of a fracture.  No bone lesion. The joints are  normally spaced and aligned.  No arthropathic change. Soft tissues are unremarkable. IMPRESSION: 1. Possible acute fracture at the ulnar base of the fifth metacarpal versus a chronic finding. Please correlate clinically. 2. No other abnormality. Electronically Signed   By: Amie Portlandavid  Ormond M.D.   On: 01/01/2016 13:35     Visual Acuity Review  Right Eye Distance:   Left Eye Distance:   Bilateral Distance:    Right Eye Near:   Left Eye Near:    Bilateral Near:         MDM  Left wrist pain -  Left 5th metacarpal fracture- Cock up splint and follow up with Orthopedics next week.  Naprosyn 500mg  one po bid x 10 days.  No use of left upper extremity Until cleared by orthopedics.   Deatra CanterWilliam J Oxford, FNP 01/01/16 [phone]

## 2017-03-06 IMAGING — DX DG WRIST COMPLETE 3+V*L*
4 series · 4 of 4 positions shown · non-contrast
Comparison: None.

CLINICAL DATA: Patient states that he was carrying heavy pipe at
work and started having pain 5 days ago in his left wrist

EXAM:
LEFT WRIST - COMPLETE 3+ VIEW

[wrist pa]
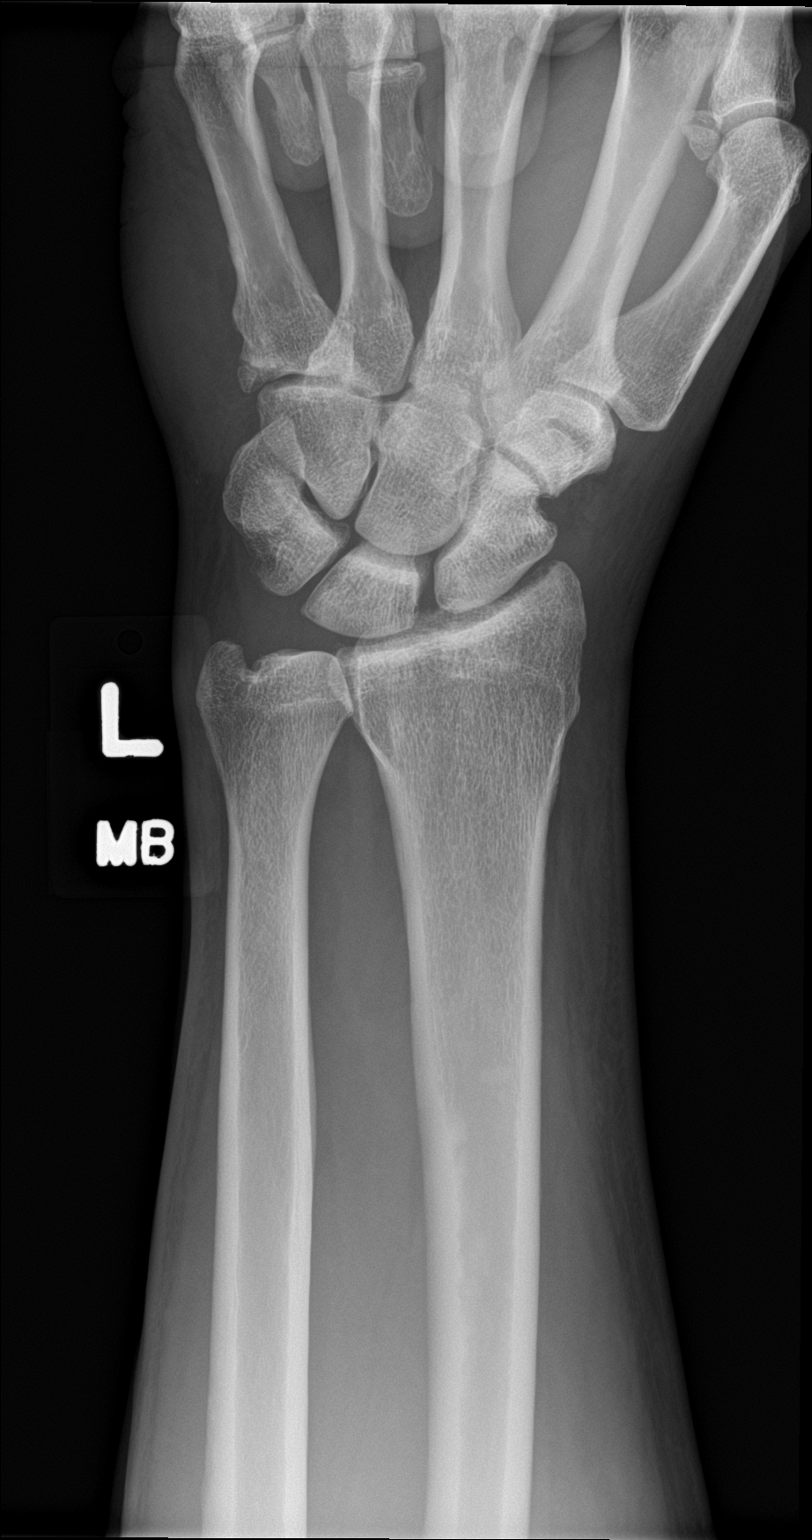

[wrist navicular]
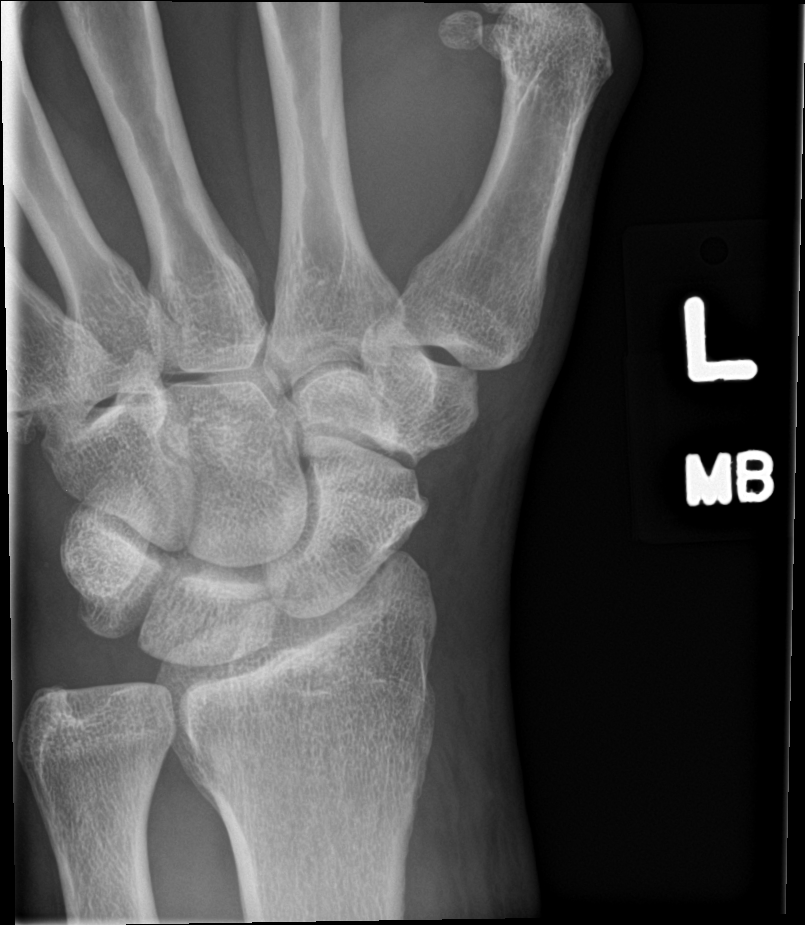

[wrist obl]
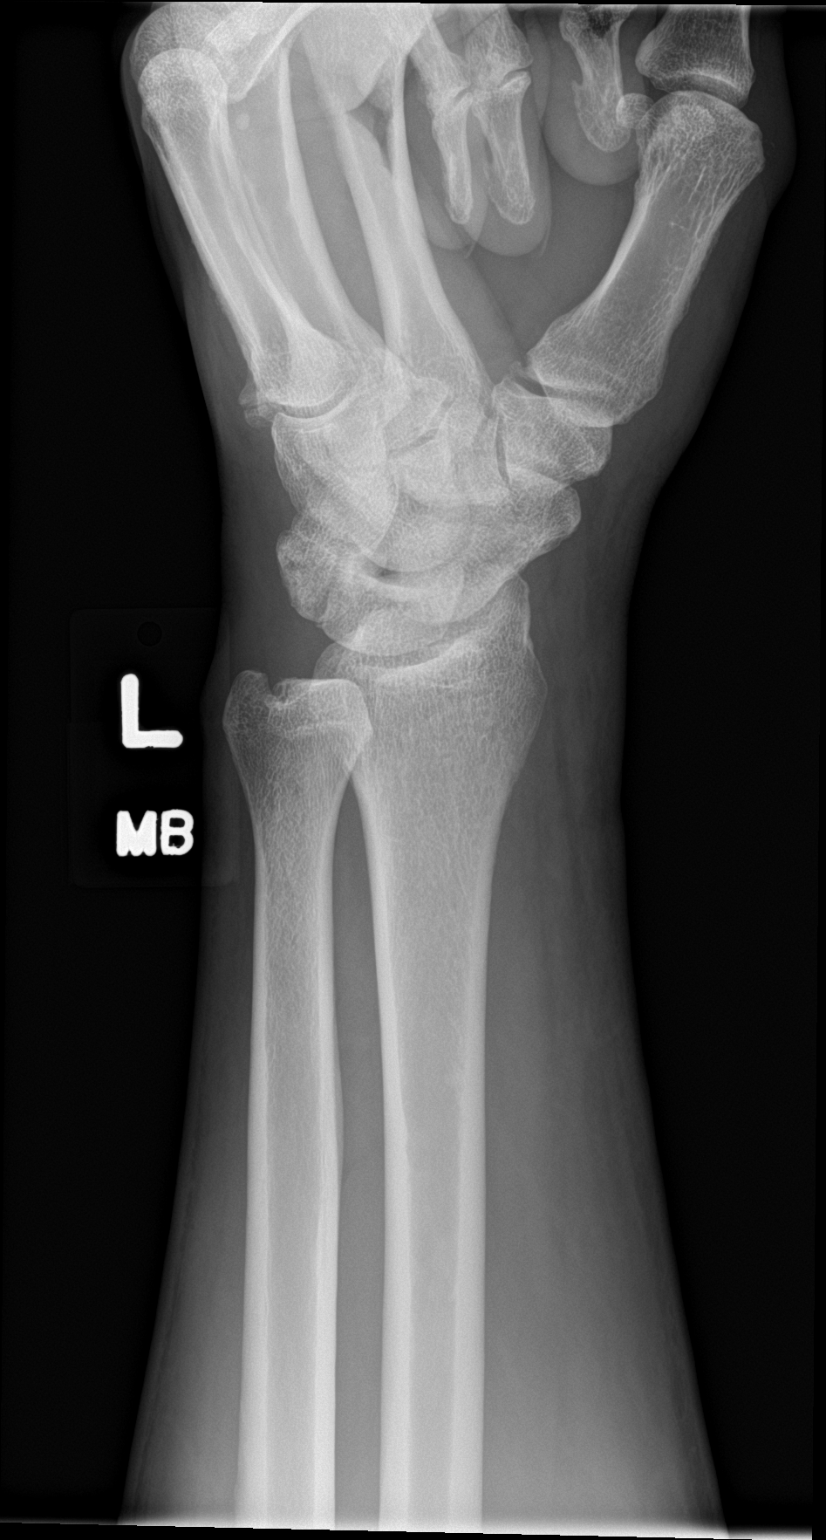

[wrist lat]
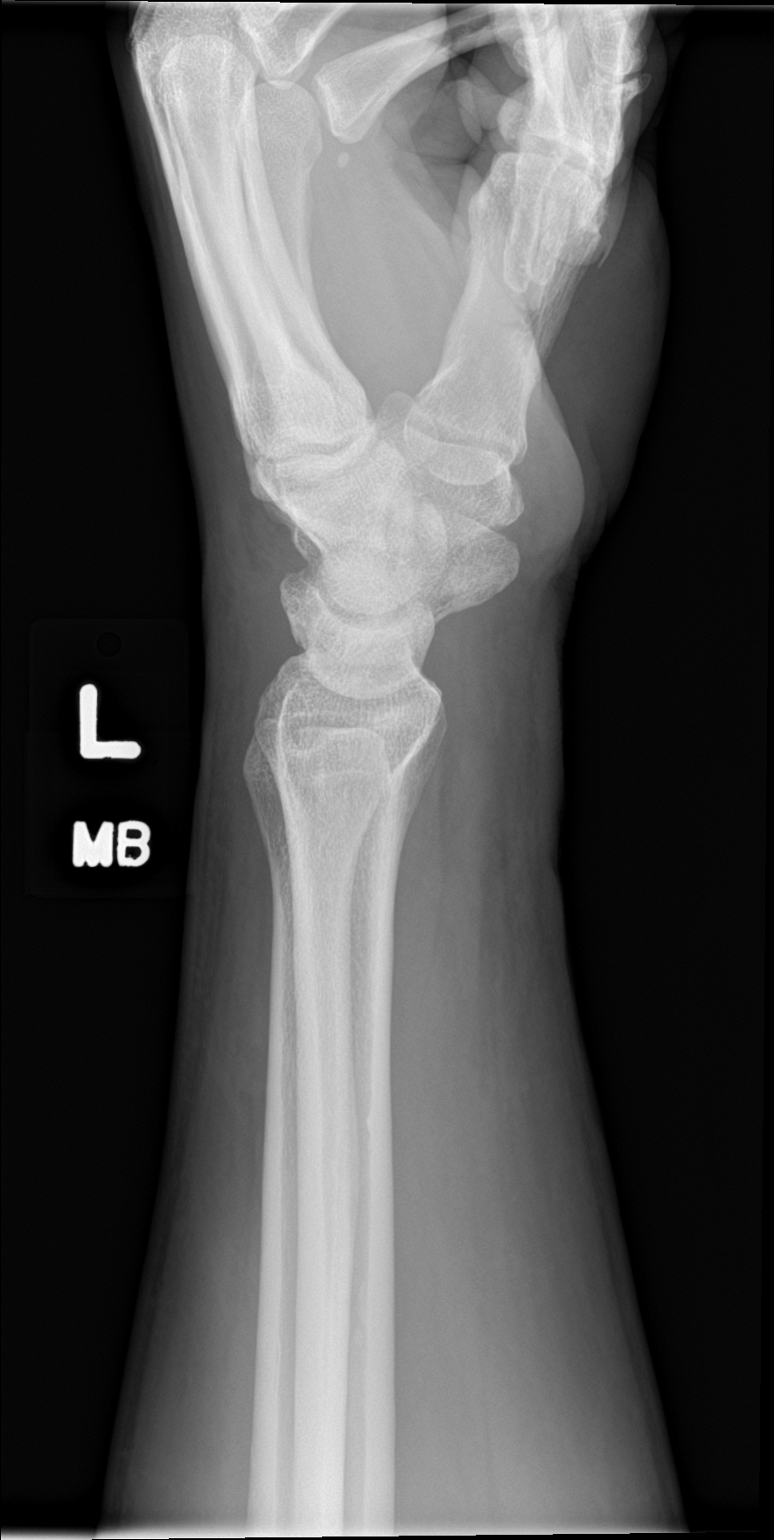

[4 of 4 positions shown; findings below may reference images not displayed]

FINDINGS: There is an apparent fracture at the ulnar base of the fifth
metacarpal, nondisplaced. This could be a chronic finding, but if it
correlates with the site of pain, should be considered likely acute.

No other evidence of a fracture.  No bone lesion.

The joints are normally spaced and aligned.  No arthropathic change.

Soft tissues are unremarkable.
IMPRESSION: 1. Possible acute fracture at the ulnar base of the fifth metacarpal
versus a chronic finding. Please correlate clinically.
2. No other abnormality.

## 2017-04-06 ENCOUNTER — Ambulatory Visit (HOSPITAL_COMMUNITY)
Admission: EM | Admit: 2017-04-06 | Discharge: 2017-04-06 | Disposition: A | Discharge status: Home / Self Care | Payer: Self-pay | Attending: Emergency Medicine | Admitting: Emergency Medicine

## 2017-04-06 ENCOUNTER — Encounter (HOSPITAL_COMMUNITY): Payer: Self-pay | Admitting: *Deleted

## 2017-04-06 DIAGNOSIS — R05 Cough: Secondary | ICD-10-CM

## 2017-04-06 DIAGNOSIS — J069 Acute upper respiratory infection, unspecified: Secondary | ICD-10-CM

## 2017-04-06 DIAGNOSIS — Z202 Contact with and (suspected) exposure to infections with a predominantly sexual mode of transmission: Secondary | ICD-10-CM | POA: Insufficient documentation

## 2017-04-06 DIAGNOSIS — Z711 Person with feared health complaint in whom no diagnosis is made: Secondary | ICD-10-CM

## 2017-04-06 DIAGNOSIS — Z113 Encounter for screening for infections with a predominantly sexual mode of transmission: Secondary | ICD-10-CM

## 2017-04-06 DIAGNOSIS — J029 Acute pharyngitis, unspecified: Secondary | ICD-10-CM | POA: Insufficient documentation

## 2017-04-06 DIAGNOSIS — Z79899 Other long term (current) drug therapy: Secondary | ICD-10-CM | POA: Insufficient documentation

## 2017-04-06 LAB — POCT URINALYSIS DIP (DEVICE)
BILIRUBIN URINE: NEGATIVE
Glucose, UA: NEGATIVE mg/dL
Hgb urine dipstick: NEGATIVE
Ketones, ur: NEGATIVE mg/dL
Leukocytes, UA: NEGATIVE
NITRITE: NEGATIVE
PH: 7 (ref 5.0–8.0)
Protein, ur: NEGATIVE mg/dL
Specific Gravity, Urine: 1.02 (ref 1.005–1.030)
Urobilinogen, UA: 0.2 mg/dL (ref 0.0–1.0)

## 2017-04-06 MED ORDER — CEFTRIAXONE SODIUM 250 MG IJ SOLR
INTRAMUSCULAR | Status: AC
  Filled 2017-04-06: qty 250

## 2017-04-06 MED ORDER — AZITHROMYCIN 250 MG PO TABS
1000.0000 mg | ORAL_TABLET | Freq: Once | ORAL | Status: AC
  Administered 2017-04-06: 1000 mg via ORAL

## 2017-04-06 MED ORDER — CEFTRIAXONE SODIUM 250 MG IJ SOLR
250.0000 mg | Freq: Once | INTRAMUSCULAR | Status: AC
  Administered 2017-04-06: 250 mg via INTRAMUSCULAR

## 2017-04-06 MED ORDER — AZITHROMYCIN 250 MG PO TABS
ORAL_TABLET | ORAL | Status: AC
  Filled 2017-04-06: qty 4

## 2017-04-06 NOTE — ED Notes (Signed)
Education completed regarding blood pressure reading today. Patient verbalized understanding the importance of monitoring blood pressure over the next several weeks. Patient will make PCP appointment if reading remains over 140.

## 2017-04-06 NOTE — ED Provider Notes (Signed)
[facility]    CSN: [id number] Arrival date & time: 04/06/17  1001     History   Chief Complaint Chief Complaint  Patient presents with  . Sore Throat  . Cough  . Exposure to STD    HPI Eric Medina is a 51 y.o. male.   Eric Medina presents with complaints of dry sore throat which started approximately 2-3 days ago. Feels worse at night or if take a deep breath. States he has some post nasal drip which causes small cough. Denies runny nose, fever, ear pain, skin rash. Nausea episode 2 days ago, without vomiting or diarrhea. Denies abdominal pain or back pain. Took allergy medication last night which helped him sleep. Had taken dayquil and nyquil as well which helped some. Without shortness of breat. Does not have a primary care provider.  Also has concerns of std as he states he occasionally has burning with urination, and occasionally with burning during intercourse. Denies penile discharge. Has two sexual partners. Does not use condoms. Denies any specific known exposure to std.    ROS per HPI.       History reviewed. No pertinent past medical history.  There are no active problems to display for this patient.   Past Surgical History:  Procedure Laterality Date  . LUNG SURGERY         Home Medications    Prior to Admission medications   Medication Sig Start Date End Date Taking? Authorizing Provider  loratadine (CLARITIN) 10 MG tablet Take 1 tablet (10 mg total) by mouth daily as needed for allergies. 08/21/14  Yes Charlynne PanderYao, David Hsienta, MD  Multiple Vitamin (MULITIVITAMIN WITH MINERALS) TABS Take 1 tablet by mouth daily.     Yes [provider]  diphenhydrAMINE (BENADRYL) 25 MG tablet Take 25 mg by mouth at bedtime as needed for itching or allergies.    [provider]  fluticasone (FLONASE) 50 MCG/ACT nasal spray Place 2 sprays into both nostrils daily. 08/21/14   Charlynne PanderYao, David Hsienta, MD  naproxen (NAPROSYN) 500 MG tablet Take 1 tablet  (500 mg total) by mouth 2 (two) times daily with a meal. 01/01/16   Deatra Canterxford, William J, FNP  OVER THE COUNTER MEDICATION Apply 1 application topically daily as needed (itching on arms). OTC itching/rash cream    [provider]    Family History Family History  Problem Relation Age of Onset  . Heart failure Mother   . Diabetes Father   . Stroke Father     Social History Social History   Tobacco Use  . Smoking status: Never Smoker  . Smokeless tobacco: Never Used  Substance Use Topics  . Alcohol use: No  . Drug use: No     Allergies   Patient has no known allergies.   Review of Systems Review of Systems   Physical Exam Triage Vital Signs ED Triage Vitals [04/06/17 1043]  Enc Vitals Group     BP (!) 152/94     Pulse Rate 78     Resp 17     Temp 98.4 F (36.9 C)     Temp Source Oral     SpO2 100 %     Weight      Height      Head Circumference      Peak Flow      Pain Score      Pain Loc      Pain Edu?      Excl. in GC?  No data found.  Updated Vital Signs BP (!) 152/94 (BP Location: Left Arm)   Pulse 78   Temp 98.4 F (36.9 C) (Oral)   Resp 17   SpO2 100%   Visual Acuity Right Eye Distance:   Left Eye Distance:   Bilateral Distance:    Right Eye Near:   Left Eye Near:    Bilateral Near:     Physical Exam  Constitutional: He is oriented to person, place, and time. He appears well-developed and well-nourished.  HENT:  Head: Normocephalic and atraumatic.  Right Ear: Tympanic membrane, external ear and ear canal normal.  Left Ear: External ear and ear canal normal. Tympanic membrane is injected.  Nose: Nose normal. Right sinus exhibits no maxillary sinus tenderness and no frontal sinus tenderness. Left sinus exhibits no maxillary sinus tenderness and no frontal sinus tenderness.  Mouth/Throat: Uvula is midline and mucous membranes are normal. Posterior oropharyngeal erythema present.  Eyes: Conjunctivae are normal. Pupils are equal,  round, and reactive to light.  Neck: Normal range of motion.  Cardiovascular: Normal rate and regular rhythm.  Pulmonary/Chest: Effort normal and breath sounds normal.  Genitourinary:  Genitourinary Comments: Denies penile lesions or discharge, exam deferred today  Lymphadenopathy:       Head (left side): Submandibular adenopathy present.    He has no cervical adenopathy.  Left firm nontender submandibular node noted; encouraged patient to monitor and have rechecked with pcp in the next 1-2 weeks once uri symptoms have resolved  Neurological: He is alert and oriented to person, place, and time.  Skin: Skin is warm and dry.  Vitals reviewed.    UC Treatments / Results  Labs (all labs ordered are listed, but only abnormal results are displayed) Labs Reviewed  POCT URINALYSIS DIP (DEVICE)  URINE CYTOLOGY ANCILLARY ONLY    EKG  EKG Interpretation None       Radiology No results found.  Procedures Procedures (including critical care time)  Medications Ordered in UC Medications  azithromycin (ZITHROMAX) tablet 1,000 mg (1,000 mg Oral Given 04/06/17 1111)  cefTRIAXone (ROCEPHIN) injection 250 mg (250 mg Intramuscular Given 04/06/17 1111)     Initial Impression / Assessment and Plan / UC Course  I have reviewed the triage vital signs and the nursing notes.  Pertinent labs & imaging results that were available during my care of the patient were reviewed by me and considered in my medical decision making (see chart for details).     URI symptoms consistent with viral illness. Continue with supportive cares. Patient requests treatment for gonorrhea / chlamydia at this time, provided prior to departure. Negative UA at this time.  Will notify of any positive findings and if any changes to treatment are needed. Recommended establish with a PCP for bp recheck and if symptoms persist or do not improve. Patient verbalized understanding and agreeable to plan.      Final  Clinical Impressions(s) / UC Diagnoses   Final diagnoses:  Viral upper respiratory tract infection  Concern about STD in male without diagnosis    ED Discharge Orders    None       Controlled Substance Prescriptions [place] Controlled Substance Registry consulted? Not Applicable   Georgetta HaberBurky, [place] Marney B, NP 04/06/17 1116    Linus MakoBurky, Lauralye Kinn B, NP 04/06/17 1124

## 2017-04-06 NOTE — ED Triage Notes (Signed)
Patient reports sore throat and cough x 2-3 days. Patient has been taking OTC meds to help with symptoms.   Patient also would like tested for STDs.

## 2017-04-06 NOTE — Discharge Instructions (Signed)
Will notify of any positive findings and if any changes to treatment are needed.  Continue with over the counter treatments for upper respiratory tract symptoms. Push fluids to ensure adequate hydration and keep secretions thin.  Tylenol and/or ibuprofen as needed for pain or fevers.  Allergy medication as needed.  Please follow up with a primary care provider to have your BP rechecked in the next week.

## 2017-04-07 LAB — URINE CYTOLOGY ANCILLARY ONLY
CHLAMYDIA, DNA PROBE: NEGATIVE
Neisseria Gonorrhea: NEGATIVE

## 2020-01-03 ENCOUNTER — Ambulatory Visit (HOSPITAL_COMMUNITY)
Admission: EM | Admit: 2020-01-03 | Discharge: 2020-01-03 | Disposition: A | Discharge status: Home / Self Care | Payer: Self-pay | Attending: Emergency Medicine | Admitting: Emergency Medicine

## 2020-01-03 ENCOUNTER — Encounter (HOSPITAL_COMMUNITY): Payer: Self-pay

## 2020-01-03 ENCOUNTER — Other Ambulatory Visit: Payer: Self-pay

## 2020-01-03 DIAGNOSIS — B356 Tinea cruris: Secondary | ICD-10-CM | POA: Insufficient documentation

## 2020-01-03 DIAGNOSIS — N489 Disorder of penis, unspecified: Secondary | ICD-10-CM | POA: Insufficient documentation

## 2020-01-03 MED ORDER — CLOTRIMAZOLE-BETAMETHASONE 1-0.05 % EX CREA: TOPICAL_CREAM | CUTANEOUS | 0 refills | Status: DC

## 2020-01-03 NOTE — Discharge Instructions (Signed)
Treat the area of redness to your low abdomen with provided cream. Keep belt/ waist line of pants off of this area as able as it is likely contributing.  I have referred you to dermatology for further management of the penile lesion. They should call you.  We will notify of you any positive findings or if any changes to treatment are needed from your std screen.  If normal or otherwise without concern to your results, we will not call you. Please log on to your MyChart to review your results if interested in so.

## 2020-01-03 NOTE — ED Provider Notes (Signed)
[facility]    CSN: [id number] Arrival date & time: 01/03/20  7341      History   Chief Complaint Chief Complaint  Patient presents with   brown bump in pubic area    HPI Eric Medina is a 54 y.o. male.   Eric Medina presents with complaints of brown spot/ area to pubic region, itchy, for approximately 1 month. Noted a bump to penis a few weeks later, about 2.5 weeks ago. Bump is not painful. Once while cleansing it drained, no drainage since. No increase in size. No previous similar.  Applied topical antifungal cream which hasn't helped although itchy has decreased some. Interested in STD screen today as well.  No penile discharge. 1 sexual partner. Doesn't use condoms. Uncertain if his partner has any std symptoms.    ROS per HPI, negative if not otherwise mentioned.      History reviewed. No pertinent past medical history.  There are no problems to display for this patient.   Past Surgical History:  Procedure Laterality Date   LUNG SURGERY         Home Medications    Prior to Admission medications   Medication Sig Start Date End Date Taking? Authorizing Provider  clotrimazole-betamethasone (LOTRISONE) cream Apply to affected area 2 times daily prn 01/03/20   Linus Mako B, NP  diphenhydrAMINE (BENADRYL) 25 MG tablet Take 25 mg by mouth at bedtime as needed for itching or allergies.    [provider]  fluticasone (FLONASE) 50 MCG/ACT nasal spray Place 2 sprays into both nostrils daily. 08/21/14   Charlynne Pander, MD  loratadine (CLARITIN) 10 MG tablet Take 1 tablet (10 mg total) by mouth daily as needed for allergies. 08/21/14   Charlynne Pander, MD  Multiple Vitamin (MULITIVITAMIN WITH MINERALS) TABS Take 1 tablet by mouth daily.      [provider]  naproxen (NAPROSYN) 500 MG tablet Take 1 tablet (500 mg total) by mouth 2 (two) times daily with a meal. 01/01/16   Oxford, Anselm Pancoast, FNP  OVER THE COUNTER MEDICATION  Apply 1 application topically daily as needed (itching on arms). OTC itching/rash cream    [provider]    Family History Family History  Problem Relation Age of Onset   Heart failure Mother    Diabetes Father    Stroke Father     Social History Social History   Tobacco Use   Smoking status: Never Smoker   Smokeless tobacco: Never Used  Substance Use Topics   Alcohol use: No   Drug use: No     Allergies   Other   Review of Systems Review of Systems   Physical Exam Triage Vital Signs ED Triage Vitals  Enc Vitals Group     BP 01/03/20 1037 135/74     Pulse Rate 01/03/20 1037 81     Resp 01/03/20 1037 16     Temp 01/03/20 1037 98.8 F (37.1 C)     Temp Source 01/03/20 1037 Oral     SpO2 01/03/20 1037 99 %     Weight 01/03/20 1041 270 lb (122.5 kg)     Height 01/03/20 1041 6\' 3"  (1.905 m)     Head Circumference --      Peak Flow --      Pain Score 01/03/20 1041 0     Pain Loc --      Pain Edu? --      Excl. in GC? --  No data found.  Updated Vital Signs BP 135/74    Pulse 81    Temp 98.8 F (37.1 C) (Oral)    Resp 16    Ht 6\' 3"  (1.905 m)    Wt 270 lb (122.5 kg)    SpO2 99%    BMI 33.75 kg/m   Visual Acuity Right Eye Distance:   Left Eye Distance:   Bilateral Distance:    Right Eye Near:   Left Eye Near:    Bilateral Near:     Physical Exam Exam conducted with a chaperone present.  Constitutional:      Appearance: He is well-developed.  Cardiovascular:     Rate and Rhythm: Normal rate.  Pulmonary:     Effort: Pulmonary effort is normal.  Genitourinary:    Penis: Uncircumcised. Lesions present.        Comments: Larger region of hyperpigmented skin to pubic region at belt line with round red circular lesion more midline to the proximal aspect of affected area; dry, raised pin point, keratinized appearing lesion to shaft of penis just proximal to glans, non tender, no redness  Skin:    General: Skin is warm and dry.    Neurological:     Mental Status: He is alert and oriented to person, place, and time.      UC Treatments / Results  Labs (all labs ordered are listed, but only abnormal results are displayed) Labs Reviewed  CYTOLOGY, (ORAL, ANAL, URETHRAL) ANCILLARY ONLY    EKG   Radiology No results found.  Procedures Procedures (including critical care time)  Medications Ordered in UC Medications - No data to display  Initial Impression / Assessment and Plan / UC Course  I have reviewed the triage vital signs and the nursing notes.  Pertinent labs & imaging results that were available during my care of the patient were reviewed by me and considered in my medical decision making (see chart for details).   low abdomen/ pubic rash appears likely related to belt/ pants, as well as localized ringworm, with topical lotrisone provided. Penile wart considered with referral to dermatology for further evaluation and management. Std screen collected and pending. Safe sex encouraged. Patient verbalized understanding and agreeable to plan.   Final Clinical Impressions(s) / UC Diagnoses   Final diagnoses:  Tinea cruris  Penile lesion     Discharge Instructions     Treat the area of redness to your low abdomen with provided cream. Keep belt/ waist line of pants off of this area as able as it is likely contributing.  I have referred you to dermatology for further management of the penile lesion. They should call you.  We will notify of you any positive findings or if any changes to treatment are needed from your std screen.  If normal or otherwise without concern to your results, we will not call you. Please log on to your MyChart to review your results if interested in so.      ED Prescriptions    Medication Sig Dispense Auth. Provider   clotrimazole-betamethasone (LOTRISONE) cream Apply to affected area 2 times daily prn 15 g B, NP     PDMP not reviewed this encounter.    Linus Mako, NP 01/03/20 1153

## 2020-01-03 NOTE — ED Triage Notes (Signed)
Pt states developed a brown spot in pubic area for about a month then a bump formed in area 2 wks after. Pt states it itches a "little."

## 2020-01-06 LAB — CYTOLOGY, (ORAL, ANAL, URETHRAL) ANCILLARY ONLY
Chlamydia: NEGATIVE
Comment: NEGATIVE
Comment: NORMAL
Neisseria Gonorrhea: NEGATIVE

## 2020-01-20 ENCOUNTER — Other Ambulatory Visit: Payer: Self-pay

## 2020-01-20 ENCOUNTER — Ambulatory Visit (HOSPITAL_COMMUNITY)
Admission: EM | Admit: 2020-01-20 | Discharge: 2020-01-20 | Disposition: A | Discharge status: Home / Self Care | Payer: Self-pay | Attending: Family Medicine | Admitting: Family Medicine

## 2020-01-20 ENCOUNTER — Encounter (HOSPITAL_COMMUNITY): Payer: Self-pay

## 2020-01-20 DIAGNOSIS — R55 Syncope and collapse: Secondary | ICD-10-CM | POA: Insufficient documentation

## 2020-01-20 DIAGNOSIS — R509 Fever, unspecified: Secondary | ICD-10-CM | POA: Insufficient documentation

## 2020-01-20 DIAGNOSIS — Z791 Long term (current) use of non-steroidal anti-inflammatories (NSAID): Secondary | ICD-10-CM | POA: Insufficient documentation

## 2020-01-20 DIAGNOSIS — R110 Nausea: Secondary | ICD-10-CM

## 2020-01-20 DIAGNOSIS — Z20822 Contact with and (suspected) exposure to covid-19: Secondary | ICD-10-CM | POA: Insufficient documentation

## 2020-01-20 DIAGNOSIS — Z79899 Other long term (current) drug therapy: Secondary | ICD-10-CM | POA: Insufficient documentation

## 2020-01-20 DIAGNOSIS — J029 Acute pharyngitis, unspecified: Secondary | ICD-10-CM | POA: Insufficient documentation

## 2020-01-20 LAB — COMPREHENSIVE METABOLIC PANEL
ALT: 27 U/L (ref 0–44)
AST: 23 U/L (ref 15–41)
Albumin: 4.1 g/dL (ref 3.5–5.0)
Alkaline Phosphatase: 52 U/L (ref 38–126)
Anion gap: 10 (ref 5–15)
BUN: 10 mg/dL (ref 6–20)
CO2: 25 mmol/L (ref 22–32)
Calcium: 9.1 mg/dL (ref 8.9–10.3)
Chloride: 105 mmol/L (ref 98–111)
Creatinine, Ser: 1.12 mg/dL (ref 0.61–1.24)
GFR calc Af Amer: >60 mL/min (ref >60)
GFR calc non Af Amer: >60 mL/min (ref >60)
Glucose, Bld: 105 mg/dL — ABNORMAL HIGH (ref 70–99)
Potassium: 4.1 mmol/L (ref 3.5–5.1)
Sodium: 140 mmol/L (ref 135–145)
Total Bilirubin: 0.7 mg/dL (ref 0.3–1.2)
Total Protein: 8.3 g/dL — ABNORMAL HIGH (ref 6.5–8.1)

## 2020-01-20 LAB — CBC WITH DIFFERENTIAL/PLATELET
Abs Immature Granulocytes: 0.01 10*3/uL (ref 0.00–0.07)
Basophils Absolute: 0 10*3/uL (ref 0.0–0.1)
Basophils Relative: 1 %
Eosinophils Absolute: 0.4 10*3/uL (ref 0.0–0.5)
Eosinophils Relative: 7 %
HCT: 40 % (ref 39.0–52.0)
Hemoglobin: 13.4 g/dL (ref 13.0–17.0)
Immature Granulocytes: 0 %
Lymphocytes Relative: 21 %
Lymphs Abs: 1.1 10*3/uL (ref 0.7–4.0)
MCH: 28.2 pg (ref 26.0–34.0)
MCHC: 33.5 g/dL (ref 30.0–36.0)
MCV: 84.2 fL (ref 80.0–100.0)
Monocytes Absolute: 0.4 10*3/uL (ref 0.1–1.0)
Monocytes Relative: 9 %
Neutro Abs: 3.1 10*3/uL (ref 1.7–7.7)
Neutrophils Relative %: 62 %
Platelets: 206 10*3/uL (ref 150–400)
RBC: 4.75 MIL/uL (ref 4.22–5.81)
RDW: 13.7 % (ref 11.5–15.5)
WBC: 5 10*3/uL (ref 4.0–10.5)
nRBC: 0 % (ref 0.0–0.2)

## 2020-01-20 LAB — LIPASE, BLOOD: Lipase: 26 U/L (ref 11–51)

## 2020-01-20 LAB — SARS CORONAVIRUS 2 (TAT 6-24 HRS): SARS Coronavirus 2: NEGATIVE

## 2020-01-20 MED ORDER — ONDANSETRON HCL 4 MG PO TABS: 4.0000 mg | ORAL_TABLET | Freq: Three times a day (TID) | ORAL | 0 refills | Status: DC | PRN

## 2020-01-20 NOTE — ED Provider Notes (Signed)
[facility]    CSN: [id number] Arrival date & time: 01/20/20  2440      History   Chief Complaint Chief Complaint  Patient presents with   Fever   Nausea    HPI Eric Medina is a 54 y.o. male.   [name] presents with complaints of symptoms which started 3 days ago. Chills, nausea, feeling faint. Symptoms haven't improved, but haven't worsened. During the day while he is active he feels better. In the evenings he feels like his symptoms of chills and nausea worsen. Has taken tylenol and nyquil which have only mildly helped. No vomiting. No diarrhea. No known fevers. No abdominal pain. Some sore throat this morning, very mild. No cough, no runny nose. No rash. No headache, no body aches. No known ill contacts. Eating doesn't necessarily worsen symptoms. Feels like yesterday could only eat half of his meal which is atypical for him. No previous abdominal surgeries. No chest pain . No arm or jaw pain. No previous heart problems.  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 Has been fully covid vaccinated.  Doesn't have a PCP   ROS per HPI, negative if not otherwise mentioned.      History reviewed. No pertinent past medical history.  There are no problems to display for this patient.   Past Surgical History:  Procedure Laterality Date   LUNG SURGERY         Home Medications    Prior to Admission medications   Medication Sig Start Date End Date Taking? Authorizing Provider  fluticasone (FLONASE) 50 MCG/ACT nasal spray Place 2 sprays into both nostrils daily. 08/21/14  Yes Charlynne Pander, MD  loratadine (CLARITIN) 10 MG tablet Take 1 tablet (10 mg total) by mouth daily as needed for allergies. 08/21/14  Yes Charlynne Pander, MD  clotrimazole-betamethasone (LOTRISONE) cream Apply to affected area 2 times daily prn 01/03/20   Linus Mako B, NP  diphenhydrAMINE (BENADRYL) 25 MG tablet Take 25 mg by mouth at  bedtime as needed for itching or allergies.    [provider]  Multiple Vitamin (MULITIVITAMIN WITH MINERALS) TABS Take 1 tablet by mouth daily.      [provider]  naproxen (NAPROSYN) 500 MG tablet Take 1 tablet (500 mg total) by mouth 2 (two) times daily with a meal. 01/01/16   [place], Anselm Pancoast, FNP  ondansetron (ZOFRAN) 4 MG tablet Take 1 tablet (4 mg total) by mouth every 8 (eight) hours as needed for nausea or vomiting. 01/20/20   Georgetta Haber, NP  OVER THE COUNTER MEDICATION Apply 1 application topically daily as needed (itching on arms). OTC itching/rash cream    [provider]    Family History Family History  Problem Relation Age of Onset   Heart failure Mother    Diabetes Father    Stroke Father     Social History Social History   Tobacco Use   Smoking status: Never Smoker   Smokeless tobacco: Never Used  Building services engineer Use: Never used  Substance Use Topics   Alcohol use: No   Drug use: No     Allergies   Other   Review of Systems Review of Systems   Physical Exam Triage Vital Signs ED Triage Vitals  Enc Vitals Group     BP 01/20/20 0952 (!) 155/107     Pulse Rate 01/20/20 0952 78  Resp 01/20/20 0952 18     Temp 01/20/20 0952 98.3 F (36.8 C)     Temp Source 01/20/20 0952 Oral     SpO2 01/20/20 0952 100 %     Weight --      Height --      Head Circumference --      Peak Flow --      Pain Score 01/20/20 0949 0     Pain Loc --      Pain Edu? --      Excl. in GC? --    No data found.  Updated Vital Signs BP (!) 155/107 (BP Location: Right Arm)    Pulse 78    Temp 98.3 F (36.8 C) (Oral)    Resp 18    SpO2 100%   Visual Acuity Right Eye Distance:   Left Eye Distance:   Bilateral Distance:    Right Eye Near:   Left Eye Near:    Bilateral Near:     Physical Exam Constitutional:      Appearance: He is well-developed.  Cardiovascular:     Rate and Rhythm: Normal rate.  Pulmonary:      Effort: Pulmonary effort is normal.  Abdominal:     Tenderness: There is no abdominal tenderness. There is no right CVA tenderness or left CVA tenderness.  Skin:    General: Skin is warm and dry.  Neurological:     Mental Status: He is alert and oriented to person, place, and time.    EKG:  NSR rate of 78 . Previous EKG was available for review. No stwave changes as interpreted by me.     UC Treatments / Results  Labs (all labs ordered are listed, but only abnormal results are displayed) Labs Reviewed  SARS CORONAVIRUS 2 (TAT 6-24 HRS)  CBC WITH DIFFERENTIAL/PLATELET  COMPREHENSIVE METABOLIC PANEL  LIPASE, BLOOD    EKG   Radiology No results found.  Procedures Procedures (including critical care time)  Medications Ordered in UC Medications - No data to display  Initial Impression / Assessment and Plan / UC Course  I have reviewed the triage vital signs and the nursing notes.  Pertinent labs & imaging results that were available during my care of the patient were reviewed by me and considered in my medical decision making (see chart for details).     No red flag findings on exam. Gi virus vs cholelithiasis vs acs considered. ekg reassuring, and symptoms are improved with activity. Labs obtained and pending, will notify of any concerning findings. Emphasized establish with PCP as may need further evaluation if symptoms persist. Return precautions provided. zofran prn. Patient verbalized understanding and agreeable to plan.   Final Clinical Impressions(s) / UC Diagnoses   Final diagnoses:  Nausea     Discharge Instructions     Small frequent sips of fluids- Pedialyte, Gatorade, water, broth- to maintain hydration.   Zofran every 8 hours as needed for nausea or vomiting.   Self isolate until covid results are back and negative.  Will notify you by phone of any positive findings. Your negative results will be sent through your MyChart.     I will call you if there  are any concerning lab findings which may be contributing to your symptoms.  Please establish with a primary care provider as you may need further evaluation if your symptoms persist.  If worsening of symptoms please go to the ER.     ED Prescriptions  Medication Sig Dispense Auth. Provider   ondansetron (ZOFRAN) 4 MG tablet Take 1 tablet (4 mg total) by mouth every 8 (eight) hours as needed for nausea or vomiting. 10 tablet Georgetta Haber, NP     PDMP not reviewed this encounter.   Georgetta Haber, NP 01/20/20 1058

## 2020-01-20 NOTE — Discharge Instructions (Signed)
Small frequent sips of fluids- Pedialyte, Gatorade, water, broth- to maintain hydration.   Zofran every 8 hours as needed for nausea or vomiting.   Self isolate until covid results are back and negative.  Will notify you by phone of any positive findings. Your negative results will be sent through your MyChart.     I will call you if there are any concerning lab findings which may be contributing to your symptoms.  Please establish with a primary care provider as you may need further evaluation if your symptoms persist.  If worsening of symptoms please go to the ER.

## 2020-01-20 NOTE — ED Triage Notes (Addendum)
Pt c/o mild congestion, subjective fever, chills, fatigue, nausea since Friday. Also reports itchy throat this morning.  Denies actual v/d, cough,SOB, loss of taste/smell. Last took tylenol and nyquil last night.

## 2020-07-25 ENCOUNTER — Other Ambulatory Visit: Payer: Self-pay

## 2020-07-25 ENCOUNTER — Ambulatory Visit (HOSPITAL_COMMUNITY)
Admission: EM | Admit: 2020-07-25 | Discharge: 2020-07-25 | Disposition: A | Discharge status: Home / Self Care | Payer: Self-pay | Attending: Emergency Medicine | Admitting: Emergency Medicine

## 2020-07-25 ENCOUNTER — Encounter (HOSPITAL_COMMUNITY): Payer: Self-pay | Admitting: Urgent Care

## 2020-07-25 DIAGNOSIS — R0981 Nasal congestion: Secondary | ICD-10-CM

## 2020-07-25 DIAGNOSIS — R059 Cough, unspecified: Secondary | ICD-10-CM

## 2020-07-25 DIAGNOSIS — J019 Acute sinusitis, unspecified: Secondary | ICD-10-CM

## 2020-07-25 MED ORDER — PROMETHAZINE-DM 6.25-15 MG/5ML PO SYRP: 5.0000 mL | ORAL_SOLUTION | Freq: Every evening | ORAL | 0 refills | Status: DC | PRN

## 2020-07-25 MED ORDER — BENZONATATE 100 MG PO CAPS: 100.0000 mg | ORAL_CAPSULE | Freq: Three times a day (TID) | ORAL | 0 refills | Status: DC | PRN

## 2020-07-25 MED ORDER — AMOXICILLIN-POT CLAVULANATE 875-125 MG PO TABS: 1.0000 | ORAL_TABLET | Freq: Two times a day (BID) | ORAL | 0 refills | Status: DC

## 2020-07-25 MED ORDER — PSEUDOEPHEDRINE HCL 60 MG PO TABS: 60.0000 mg | ORAL_TABLET | Freq: Three times a day (TID) | ORAL | 0 refills | Status: DC | PRN

## 2020-07-25 MED ORDER — CETIRIZINE HCL 10 MG PO TABS: 10.0000 mg | ORAL_TABLET | Freq: Every day | ORAL | 0 refills | Status: DC

## 2020-07-25 NOTE — ED Triage Notes (Signed)
Onset 3 weeks ago of runny nose, cough, phlegm.  Initially green phlegm, now clear per patient

## 2020-07-25 NOTE — ED Provider Notes (Signed)
[facility]   MRN: [id number] DOB: 1965-08-25  Subjective:   Eric Medina is a 55 y.o. male presenting for 3-week history of persistent sinus congestion, sinus headaches, scratchy throat, coughing.  Patient has tried multiple over-the-counter medications including Flonase and Claritin but has not gotten much relief.  Denies fever, chest pain, shortness of breath, body aches, rashes.  Denies history of lung disorders, sarcoidosis, asthma.  Patient is not a smoker.  He does not have diabetes.  He reports having had a procedure many years ago and believes it might have been a lung procedure but does not remember specifics as it was many years ago.  No current facility-administered medications for this encounter.  Current Outpatient Medications:  .  clotrimazole-betamethasone (LOTRISONE) cream, Apply to affected area 2 times daily prn, Disp: 15 g, Rfl: 0 .  diphenhydrAMINE (BENADRYL) 25 MG tablet, Take 25 mg by mouth at bedtime as needed for itching or allergies., Disp: , Rfl:  .  fluticasone (FLONASE) 50 MCG/ACT nasal spray, Place 2 sprays into both nostrils daily., Disp: 16 g, Rfl: 0 .  loratadine (CLARITIN) 10 MG tablet, Take 1 tablet (10 mg total) by mouth daily as needed for allergies., Disp: 30 tablet, Rfl: 0 .  Multiple Vitamin (MULITIVITAMIN WITH MINERALS) TABS, Take 1 tablet by mouth daily.  , Disp: , Rfl:  .  naproxen (NAPROSYN) 500 MG tablet, Take 1 tablet (500 mg total) by mouth 2 (two) times daily with a meal., Disp: 20 tablet, Rfl: 0 .  ondansetron (ZOFRAN) 4 MG tablet, Take 1 tablet (4 mg total) by mouth every 8 (eight) hours as needed for nausea or vomiting., Disp: 10 tablet, Rfl: 0 .  OVER THE COUNTER MEDICATION, Apply 1 application topically daily as needed (itching on arms). OTC itching/rash cream, Disp: , Rfl:    Allergies  Allergen Reactions  . Other Other (See Comments)    Was given pain pill and pt felt crazy, dilusional and not with it.      History reviewed. No pertinent past medical history.   Past Surgical History:  Procedure Laterality Date  . LUNG SURGERY      Family History  Problem Relation Age of Onset  . Heart failure Mother   . Diabetes Father   . Stroke Father     Social History   Tobacco Use  . Smoking status: Never Smoker  . Smokeless tobacco: Never Used  Vaping Use  . Vaping Use: Never used  Substance Use Topics  . Alcohol use: No  . Drug use: No    ROS   Objective:   Vitals: BP 139/85 (BP Location: Right Arm) Comment: large cuff  Pulse 94   Temp 99.2 F (37.3 C) (Oral)   Resp 20   SpO2 99%   Physical Exam Constitutional:      General: He is not in acute distress.    Appearance: Normal appearance. He is well-developed and normal weight. He is not ill-appearing, toxic-appearing or diaphoretic.  HENT:     Head: Normocephalic and atraumatic.     Right Ear: Tympanic membrane, ear canal and external ear normal. There is no impacted cerumen.     Left Ear: Tympanic membrane, ear canal and external ear normal. There is no impacted cerumen.     Nose: Congestion and rhinorrhea present.     Mouth/Throat:     Mouth: Mucous membranes are moist.     Pharynx: No oropharyngeal exudate or posterior oropharyngeal erythema.  Comments: Significant postnasal drainage overlying pharynx. Eyes:     General: No scleral icterus.       Right eye: No discharge.        Left eye: No discharge.     Extraocular Movements: Extraocular movements intact.     Conjunctiva/sclera: Conjunctivae normal.     Pupils: Pupils are equal, round, and reactive to light.  Cardiovascular:     Rate and Rhythm: Normal rate and regular rhythm.     Heart sounds: Normal heart sounds. No murmur heard. No friction rub. No gallop.   Pulmonary:     Effort: Pulmonary effort is normal. No respiratory distress.     Breath sounds: Normal breath sounds. No stridor. No wheezing, rhonchi or rales.  Musculoskeletal:     Cervical  back: Normal range of motion and neck supple. No rigidity. No muscular tenderness.  Neurological:     General: No focal deficit present.     Mental Status: He is alert and oriented to person, place, and time.  Psychiatric:        Mood and Affect: Mood normal.        Behavior: Behavior normal.        Thought Content: Thought content normal.        Judgment: Judgment normal.      Assessment and Plan :   PDMP not reviewed this encounter.  1. Acute sinusitis, recurrence not specified, unspecified location   2. Nasal congestion   3. Cough     Will start empiric treatment for sinusitis with Augmentin.  Recommended supportive care otherwise including the use of oral antihistamine, decongestant. Counseled patient on potential for adverse effects with medications prescribed/recommended today, ER and return-to-clinic precautions discussed, patient verbalized understanding.    Wallis Bamberg, PA-C 07/25/20 1100

## 2021-02-28 ENCOUNTER — Other Ambulatory Visit: Payer: Self-pay

## 2021-02-28 ENCOUNTER — Ambulatory Visit (HOSPITAL_COMMUNITY)
Admission: EM | Admit: 2021-02-28 | Discharge: 2021-02-28 | Disposition: A | Discharge status: Home / Self Care | Payer: Self-pay | Attending: Physician Assistant | Admitting: Physician Assistant

## 2021-02-28 ENCOUNTER — Encounter (HOSPITAL_COMMUNITY): Payer: Self-pay | Admitting: Emergency Medicine

## 2021-02-28 DIAGNOSIS — L02415 Cutaneous abscess of right lower limb: Secondary | ICD-10-CM

## 2021-02-28 DIAGNOSIS — R030 Elevated blood-pressure reading, without diagnosis of hypertension: Secondary | ICD-10-CM

## 2021-02-28 DIAGNOSIS — L0291 Cutaneous abscess, unspecified: Secondary | ICD-10-CM

## 2021-02-28 MED ORDER — DOXYCYCLINE HYCLATE 100 MG PO CAPS: 100.0000 mg | ORAL_CAPSULE | Freq: Two times a day (BID) | ORAL | 0 refills | Status: DC

## 2021-02-28 NOTE — ED Provider Notes (Signed)
[facility]    CSN: [id number] Arrival date & time: 02/28/21  1311      History   Chief Complaint Chief Complaint  Patient presents with   Abscess    HPI Eric Medina is a 55 y.o. male.   Patient presents today with a several week history of recurrent abscesses.  Reports first abscess was in his groin but drained spontaneously.  He has then had 2 recurrent episodes on the inner part of his thigh.  He denies any recent antibiotic use.  He denies recurrent abscess, recurrent skin infection, history of MRSA.  He denies history of diabetes.  He has been cleaning this with soap and water and applying Vaseline but has not tried any topical antibiotics.  Denies any systemic symptoms including fever, nausea, vomiting, headache, dizziness.  Blood pressure is noted to be elevated today.  Patient attributes this to discomfort from abscess as well as anxiety.  Denies any chest pain, shortness of breath, headache, dizziness, vision changes.  He does not take antihypertensive medication.  Denies any recent increase in sodium consumption, caffeine consumption, decongestant use.  Does not have PCP currently.   History reviewed. No pertinent past medical history.  There are no problems to display for this patient.   Past Surgical History:  Procedure Laterality Date   LUNG SURGERY         Home Medications    Prior to Admission medications   Medication Sig Start Date End Date Taking? Authorizing Provider  doxycycline (VIBRAMYCIN) 100 MG capsule Take 1 capsule (100 mg total) by mouth 2 (two) times daily. 02/28/21  Yes Demitris Pokorny K, PA-C  benzonatate (TESSALON) 100 MG capsule Take 1-2 capsules (100-200 mg total) by mouth 3 (three) times daily as needed for cough. 07/25/20   Wallis Bamberg, PA-C  cetirizine (ZYRTEC ALLERGY) 10 MG tablet Take 1 tablet (10 mg total) by mouth daily. 07/25/20   Wallis Bamberg, PA-C  clotrimazole-betamethasone (LOTRISONE) cream Apply to affected area 2  times daily prn 01/03/20   Linus Mako B, NP  diphenhydrAMINE (BENADRYL) 25 MG tablet Take 25 mg by mouth at bedtime as needed for itching or allergies.    [provider]  fluticasone (FLONASE) 50 MCG/ACT nasal spray Place 2 sprays into both nostrils daily. 08/21/14   Charlynne Pander, MD  loratadine (CLARITIN) 10 MG tablet Take 1 tablet (10 mg total) by mouth daily as needed for allergies. 08/21/14   Charlynne Pander, MD  Multiple Vitamin (MULITIVITAMIN WITH MINERALS) TABS Take 1 tablet by mouth daily.    [provider]  naproxen (NAPROSYN) 500 MG tablet Take 1 tablet (500 mg total) by mouth 2 (two) times daily with a meal. 01/01/16   [place], Anselm Pancoast, FNP  ondansetron (ZOFRAN) 4 MG tablet Take 1 tablet (4 mg total) by mouth every 8 (eight) hours as needed for nausea or vomiting. 01/20/20   Georgetta Haber, NP  OVER THE COUNTER MEDICATION Apply 1 application topically daily as needed (itching on arms). OTC itching/rash cream    [provider]  promethazine-dextromethorphan (PROMETHAZINE-DM) 6.25-15 MG/5ML syrup Take 5 mLs by mouth at bedtime as needed for cough. 07/25/20   Wallis Bamberg, PA-C  pseudoephedrine (SUDAFED) 60 MG tablet Take 1 tablet (60 mg total) by mouth every 8 (eight) hours as needed for congestion. 07/25/20   Wallis Bamberg, PA-C    Family History Family History  Problem Relation Age of Onset   Heart failure Mother    Diabetes  Father    Stroke Father     Social History Social History   Tobacco Use   Smoking status: Never   Smokeless tobacco: Never  Vaping Use   Vaping Use: Never used  Substance Use Topics   Alcohol use: No   Drug use: No     Allergies   Other   Review of Systems Review of Systems  Constitutional:  Positive for activity change. Negative for appetite change, fatigue and fever.  Respiratory:  Negative for cough and shortness of breath.   Cardiovascular:  Negative for chest pain.  Gastrointestinal:  Negative for  diarrhea, nausea and vomiting.  Skin:  Positive for color change and wound.  Neurological:  Negative for dizziness, light-headedness and headaches.    Physical Exam Triage Vital Signs ED Triage Vitals  Enc Vitals Group     BP 02/28/21 1414 (!) 167/93     Pulse Rate 02/28/21 1414 82     Resp 02/28/21 1414 16     Temp 02/28/21 1414 98.3 F (36.8 C)     Temp Source 02/28/21 1414 Oral     SpO2 02/28/21 1414 98 %     Weight --      Height --      Head Circumference --      Peak Flow --      Pain Score 02/28/21 1412 0     Pain Loc --      Pain Edu? --      Excl. in GC? --    No data found.  Updated Vital Signs BP (!) 167/93 (BP Location: Right Arm)   Pulse 82   Temp 98.3 F (36.8 C) (Oral)   Resp 16   SpO2 98%   Visual Acuity Right Eye Distance:   Left Eye Distance:   Bilateral Distance:    Right Eye Near:   Left Eye Near:    Bilateral Near:     Physical Exam Vitals reviewed.  Constitutional:      General: He is awake.     Appearance: Normal appearance. He is well-developed. He is not ill-appearing.     Comments: Very pleasant male appears stated age no acute distress sitting comfortably in exam room  HENT:     Head: Normocephalic and atraumatic.     Mouth/Throat:     Pharynx: No oropharyngeal exudate, posterior oropharyngeal erythema or uvula swelling.  Cardiovascular:     Rate and Rhythm: Normal rate and regular rhythm.     Heart sounds: Normal heart sounds, S1 normal and S2 normal. No murmur heard. Pulmonary:     Effort: Pulmonary effort is normal.     Breath sounds: Normal breath sounds. No stridor. No wheezing, rhonchi or rales.     Comments: Clear to auscultation bilaterally Abdominal:     General: Bowel sounds are normal.     Palpations: Abdomen is soft.     Tenderness: There is no abdominal tenderness.     Comments: Benign abdominal exam  Skin:    Findings: Abscess present.     Comments: 3 cm x 2 cm indurated nodule noted right medial thigh.   Overlying hyperpigmentation without erythema or warmth.  No streaking or evidence of lymphangitis.  No fluctuance noted.  Neurological:     Mental Status: He is alert.  Psychiatric:        Behavior: Behavior is cooperative.     UC Treatments / Results  Labs (all labs ordered are listed, but only abnormal results are displayed)  Labs Reviewed - No data to display  EKG   Radiology No results found.  Procedures Procedures (including critical care time)  Medications Ordered in UC Medications - No data to display  Initial Impression / Assessment and Plan / UC Course  I have reviewed the triage vital signs and the nursing notes.  Pertinent labs & imaging results that were available during my care of the patient were reviewed by me and considered in my medical decision making (see chart for details).     No significant fluctuance that would allow for I&D in clinic today.  Patient was started on oral antibiotics of doxycycline 100 mg twice daily.  Recommended he use warm compresses multiple times per day and if this enlarges or has a soft center he should return to consider I&D.  He is to keep area clean with warm water and soap and apply topical antibiotic cream to any open wounds.  Discussed that if he has any systemic symptoms including fever, headache, nausea, vomiting, body aches, spread of pain, increased redness he needs to be seen immediately.  Discussed alarm symptoms that warrant emergent evaluation.  Blood pressure was noted to be elevated today which is likely related to anxiety and discomfort.  Patient denies any signs/symptoms of endorgan damage.  Recommended he monitor his blood pressure at home and if this is persistently above 140/90 he needs to be reevaluated.  Encouraged him to avoid caffeine, salt, decongestants.  Discussed that if he develops any chest pain, shortness of breath, headache, dizziness, vision changes in the setting of high blood pressure he needs to go  directly to the emergency room.  Final Clinical Impressions(s) / UC Diagnoses   Final diagnoses:  Abscess  Elevated blood pressure reading in office without diagnosis of hypertension     Discharge Instructions      We are not going to drain the abscess today.  Please start doxycycline 100 mg twice daily for 10 days.  This can make you sensitive to the sun so please stay out of the sun while on it.  Use warm compresses multiple times per day.  Use over-the-counter medications such as Tylenol for pain relief.  If this enlarges you may need to return for [place] to drain in the future.  If you develop any systemic symptoms including a fever, fever, abdominal pain, nausea, vomiting, headache you need to be reevaluated.  Your blood pressure was elevated.  Please monitor this at home.  If this is persistently above 140/90 you need to be reevaluated.  Avoid salt, caffeine, decongestants.  If you develop chest pain, shortness of breath, headache, vision changes, dizziness in setting of high blood pressure you need to go to the emergency room.     ED Prescriptions     Medication Sig Dispense Auth. Provider   doxycycline (VIBRAMYCIN) 100 MG capsule Take 1 capsule (100 mg total) by mouth 2 (two) times daily. 20 capsule Ryen Heitmeyer, Noberto Retort, PA-C      PDMP not reviewed this encounter.   Jeani Hawking, PA-C 02/28/21 1451

## 2021-02-28 NOTE — ED Triage Notes (Signed)
Pt presents with re occurring abscesses over the last month.

## 2021-02-28 NOTE — Discharge Instructions (Signed)
We are not going to drain the abscess today.  Please start doxycycline 100 mg twice daily for 10 days.  This can make you sensitive to the sun so please stay out of the sun while on it.  Use warm compresses multiple times per day.  Use over-the-counter medications such as Tylenol for pain relief.  If this enlarges you may need to return for [place] to drain in the future.  If you develop any systemic symptoms including a fever, fever, abdominal pain, nausea, vomiting, headache you need to be reevaluated.  Your blood pressure was elevated.  Please monitor this at home.  If this is persistently above 140/90 you need to be reevaluated.  Avoid salt, caffeine, decongestants.  If you develop chest pain, shortness of breath, headache, vision changes, dizziness in setting of high blood pressure you need to go to the emergency room.

## 2022-05-22 ENCOUNTER — Ambulatory Visit (HOSPITAL_COMMUNITY)
Admission: EM | Admit: 2022-05-22 | Discharge: 2022-05-22 | Disposition: A | Discharge status: Home / Self Care | Payer: Commercial Managed Care - HMO | Attending: Family Medicine | Admitting: Family Medicine

## 2022-05-22 ENCOUNTER — Encounter (HOSPITAL_COMMUNITY): Payer: Self-pay

## 2022-05-22 ENCOUNTER — Ambulatory Visit (INDEPENDENT_AMBULATORY_CARE_PROVIDER_SITE_OTHER): Payer: Commercial Managed Care - HMO

## 2022-05-22 DIAGNOSIS — R059 Cough, unspecified: Secondary | ICD-10-CM | POA: Diagnosis not present

## 2022-05-22 DIAGNOSIS — R062 Wheezing: Secondary | ICD-10-CM | POA: Diagnosis not present

## 2022-05-22 DIAGNOSIS — J019 Acute sinusitis, unspecified: Secondary | ICD-10-CM | POA: Diagnosis not present

## 2022-05-22 MED ORDER — CEFDINIR 300 MG PO CAPS: 600.0000 mg | ORAL_CAPSULE | Freq: Every day | ORAL | 0 refills | Status: AC

## 2022-05-22 MED ORDER — PREDNISONE 20 MG PO TABS: 40.0000 mg | ORAL_TABLET | Freq: Every day | ORAL | 0 refills | Status: AC

## 2022-05-22 MED ORDER — ALBUTEROL SULFATE HFA 108 (90 BASE) MCG/ACT IN AERS: 2.0000 | INHALATION_SPRAY | RESPIRATORY_TRACT | 0 refills | Status: DC | PRN

## 2022-05-22 NOTE — ED Provider Notes (Signed)
[facility]    CSN: [id number] Arrival date & time: 05/22/22  1007      History   Chief Complaint Chief Complaint  Patient presents with   Cough    HPI Eric Medina is a 57 y.o. male.    Cough  Here for cough and congestion.  About 3 weeks ago he had an upper respiratory infection with nasal congestion and rhinorrhea and cough.  He took some Mucinex and most of the symptoms did improve, but he was left with a cough.  Now in the last 2 weeks he has had postnasal drainage, some sinus pressure, and then a good bit of cough and he feels that he still coughs up mucus from his lungs. No fever or chills at any point  He has heard some wheezing; he reports he has never been diagnosed with asthma and is never used an inhaler, but he has had "bronchitis" in the past.  He states the subcutaneous nodule in his left anterior neck has been there a while and is not growing   History reviewed. No pertinent past medical history.  There are no problems to display for this patient.   Past Surgical History:  Procedure Laterality Date   LUNG SURGERY         Home Medications    Prior to Admission medications   Medication Sig Start Date End Date Taking? Authorizing Provider  albuterol (VENTOLIN HFA) 108 (90 Base) MCG/ACT inhaler Inhale 2 puffs into the lungs every 4 (four) hours as needed for wheezing or shortness of breath. 05/22/22  Yes Barrett Henle, MD  cefdinir (OMNICEF) 300 MG capsule Take 2 capsules (600 mg total) by mouth daily for 7 days. 05/22/22 05/29/22 Yes Ananda Caya, Gwenlyn Perking, MD  predniSONE (DELTASONE) 20 MG tablet Take 2 tablets (40 mg total) by mouth daily with breakfast for 5 days. 05/22/22 05/27/22 Yes Barrett Henle, MD  Multiple Vitamin (MULITIVITAMIN WITH MINERALS) TABS Take 1 tablet by mouth daily.    [provider]  OVER THE COUNTER MEDICATION Apply 1 application topically daily as needed (itching on arms). OTC itching/rash cream     [provider]    Family History Family History  Problem Relation Age of Onset   Heart failure Mother    Diabetes Father    Stroke Father     Social History Social History   Tobacco Use   Smoking status: Never   Smokeless tobacco: Never  Vaping Use   Vaping Use: Never used  Substance Use Topics   Alcohol use: No   Drug use: No     Allergies   Other   Review of Systems Review of Systems  Respiratory:  Positive for cough.      Physical Exam Triage Vital Signs ED Triage Vitals [05/22/22 1036]  Enc Vitals Group     BP (!) 181/93     Pulse Rate 86     Resp 16     Temp 98.3 F (36.8 C)     Temp Source Oral     SpO2 98 %     Weight      Height      Head Circumference      Peak Flow      Pain Score      Pain Loc      Pain Edu?      Excl. in [place]?    No data found.  Updated Vital Signs BP (!) 181/93 (BP Location: Left  Arm)   Pulse 86   Temp 98.3 F (36.8 C) (Oral)   Resp 16   SpO2 98%   Visual Acuity Right Eye Distance:   Left Eye Distance:   Bilateral Distance:    Right Eye Near:   Left Eye Near:    Bilateral Near:     Physical Exam Vitals reviewed.  Constitutional:      General: He is not in acute distress.    Appearance: He is not ill-appearing, toxic-appearing or diaphoretic.  HENT:     Nose: Nose normal.     Mouth/Throat:     Mouth: Mucous membranes are moist.     Comments: No erythema, but there is clear mucus draining Eyes:     Extraocular Movements: Extraocular movements intact.     Conjunctiva/sclera: Conjunctivae normal.     Pupils: Pupils are equal, round, and reactive to light.  Cardiovascular:     Rate and Rhythm: Normal rate and regular rhythm.     Heart sounds: No murmur heard. Pulmonary:     Effort: No respiratory distress.     Breath sounds: No stridor. No wheezing, rhonchi or rales.  Musculoskeletal:     Cervical back: Neck supple.  Lymphadenopathy:     Cervical: No cervical adenopathy.  Skin:     Coloration: Skin is not jaundiced or pale.  Neurological:     General: No focal deficit present.     Mental Status: He is alert and oriented to person, place, and time.  Psychiatric:        Behavior: Behavior normal.      UC Treatments / Results  Labs (all labs ordered are listed, but only abnormal results are displayed) Labs Reviewed - No data to display  EKG   Radiology DG Chest 2 View  Result Date: 05/22/2022 CLINICAL DATA:  57 year old male with cough for 2 weeks. Wheezing and congestion. EXAM: CHEST - 2 VIEW COMPARISON:  Chest radiographs 08/21/2014 and earlier. FINDINGS: Normal lung volumes and mediastinal contours. Normalized left lung volume and ventilation compared to 2016, with lung markings now resembling radiographs on 2010. Both lungs appear clear, no pneumothorax or pleural effusion. No acute osseous abnormality identified. Negative visible bowel gas. IMPRESSION: No acute cardiopulmonary abnormality. Electronically Signed   By: Genevie Ann M.D.   On: 05/22/2022 11:12    Procedures Procedures (including critical care time)  Medications Ordered in UC Medications - No data to display  Initial Impression / Assessment and Plan / UC Course  I have reviewed the triage vital signs and the nursing notes.  Pertinent labs & imaging results that were available during my care of the patient were reviewed by me and considered in my medical decision making (see chart for details).        Chest x-ray is clear.  I am going to treat for bronchospasm and acute sinusitis. Final Clinical Impressions(s) / UC Diagnoses   Final diagnoses:  Acute sinusitis, recurrence not specified, unspecified location     Discharge Instructions      Your chest x-ray was clear.  Take cefdinir 300 mg--2 capsules together daily for 7 days; this is for possible sinus infection  Albuterol inhaler--do 2 puffs every 4 hours as needed for shortness of breath or wheezing  Take prednisone 20 mg--2  daily for 5 days; this is for possible inflammation in your lungs since you have wheezed some       ED Prescriptions     Medication Sig Dispense Auth. Provider  cefdinir (OMNICEF) 300 MG capsule Take 2 capsules (600 mg total) by mouth daily for 7 days. 14 capsule Earlyn Sylvan, Janace Aris, MD   albuterol (VENTOLIN HFA) 108 (90 Base) MCG/ACT inhaler Inhale 2 puffs into the lungs every 4 (four) hours as needed for wheezing or shortness of breath. 1 each Zenia Resides, MD   predniSONE (DELTASONE) 20 MG tablet Take 2 tablets (40 mg total) by mouth daily with breakfast for 5 days. 10 tablet Marlinda Mike Janace Aris, MD      PDMP not reviewed this encounter.   Zenia Resides, MD 05/22/22 [phone]

## 2022-05-22 NOTE — Discharge Instructions (Signed)
Your chest x-ray was clear.  Take cefdinir 300 mg--2 capsules together daily for 7 days; this is for possible sinus infection  Albuterol inhaler--do 2 puffs every 4 hours as needed for shortness of breath or wheezing  Take prednisone 20 mg--2 daily for 5 days; this is for possible inflammation in your lungs since you have wheezed some

## 2022-05-22 NOTE — ED Triage Notes (Signed)
Pt presents with severe cough and congestion x 2 weeks. Pt reports taking mucinex, which help but he continues to cough.

## 2024-03-25 ENCOUNTER — Encounter (HOSPITAL_COMMUNITY): Payer: Self-pay | Admitting: Emergency Medicine

## 2024-03-25 ENCOUNTER — Other Ambulatory Visit: Payer: Self-pay

## 2024-03-25 ENCOUNTER — Ambulatory Visit (HOSPITAL_COMMUNITY)
Admission: EM | Admit: 2024-03-25 | Discharge: 2024-03-25 | Disposition: A | Discharge status: Home / Self Care | Payer: Self-pay

## 2024-03-25 DIAGNOSIS — M7989 Other specified soft tissue disorders: Secondary | ICD-10-CM | POA: Insufficient documentation

## 2024-03-25 DIAGNOSIS — R2232 Localized swelling, mass and lump, left upper limb: Secondary | ICD-10-CM | POA: Insufficient documentation

## 2024-03-25 DIAGNOSIS — M10072 Idiopathic gout, left ankle and foot: Secondary | ICD-10-CM

## 2024-03-25 DIAGNOSIS — M79672 Pain in left foot: Secondary | ICD-10-CM | POA: Insufficient documentation

## 2024-03-25 LAB — CBC WITH DIFFERENTIAL/PLATELET
Abs Immature Granulocytes: 0.02 K/uL (ref 0.00–0.07)
Basophils Absolute: 0.1 K/uL (ref 0.0–0.1)
Basophils Relative: 1 %
Eosinophils Absolute: 0.4 K/uL (ref 0.0–0.5)
Eosinophils Relative: 6 %
HCT: 35.9 % — ABNORMAL LOW (ref 39.0–52.0)
Hemoglobin: 12.2 g/dL — ABNORMAL LOW (ref 13.0–17.0)
Immature Granulocytes: 0 %
Lymphocytes Relative: 16 %
Lymphs Abs: 1 K/uL (ref 0.7–4.0)
MCH: 27.2 pg (ref 26.0–34.0)
MCHC: 34 g/dL (ref 30.0–36.0)
MCV: 80 fL (ref 80.0–100.0)
Monocytes Absolute: 0.4 K/uL (ref 0.1–1.0)
Monocytes Relative: 7 %
Neutro Abs: 4.2 K/uL (ref 1.7–7.7)
Neutrophils Relative %: 70 %
Platelets: 269 K/uL (ref 150–400)
RBC: 4.49 MIL/uL (ref 4.22–5.81)
RDW: 12.6 % (ref 11.5–15.5)
WBC: 6 K/uL (ref 4.0–10.5)
nRBC: 0 % (ref 0.0–0.2)

## 2024-03-25 LAB — URIC ACID: Uric Acid, Serum: 8.8 mg/dL — ABNORMAL HIGH (ref 3.7–8.6)

## 2024-03-25 LAB — C-REACTIVE PROTEIN: CRP: 2.3 mg/dL — ABNORMAL HIGH (ref <1.0)

## 2024-03-25 MED ORDER — ALLOPURINOL 100 MG PO TABS: 100.0000 mg | ORAL_TABLET | Freq: Every day | ORAL | 5 refills | Status: AC

## 2024-03-25 MED ORDER — LIDOCAINE HCL (PF) 1 % IJ SOLN
5.0000 mL | Freq: Once | INTRAMUSCULAR | Status: AC
  Administered 2024-03-25: 5 mL

## 2024-03-25 MED ORDER — LIDOCAINE HCL (PF) 2 % IJ SOLN
INTRAMUSCULAR | Status: AC
  Filled 2024-03-25: qty 5

## 2024-03-25 MED ORDER — MELOXICAM 15 MG PO TABS: 15.0000 mg | ORAL_TABLET | Freq: Every day | ORAL | 0 refills | Status: AC

## 2024-03-25 MED ORDER — DOXYCYCLINE HYCLATE 100 MG PO CAPS: 100.0000 mg | ORAL_CAPSULE | Freq: Two times a day (BID) | ORAL | 0 refills | Status: AC

## 2024-03-25 NOTE — ED Triage Notes (Signed)
 Pt reports two weeks ago woke up with right index finger swollen and painful. Couple nights later woke up with left elbow pain and swelling.  Pt reports last Wed left ankle was hurting and painful. Reports been out of work since.

## 2024-03-25 NOTE — Discharge Instructions (Addendum)
 Will get lab work today prior to leaving Follow-up with sports medicine center; ask for Dr. Teressa or one of the fellows  Schedule appointment with sports medicine or sometime later this week if possible Take doxycycline  twice daily with food for the next 10 days Take meloxicam once daily with food every morning for the next 7-14 days While I do not anticipate this, go to ER if you develop any acute worsening of your symptoms or develop new onset fever or chills

## 2024-03-25 NOTE — ED Provider Notes (Signed)
 [facility]    CSN: [id number] Arrival date & time: 03/25/24  1042      History   Chief Complaint Chief Complaint  Patient presents with   Joint Swelling    HPI Eric Medina is a 58 y.o. male.   HPI  Patient is a 58 year old male who presents today complaining of a number of various musculoskeletal related issues.  Patient states that roughly 2 weeks ago he had sudden onset swelling of his  right index finger.  This has progressively made it difficult for him to flex it fully due to the swelling.  Has some redness associated with finger as well but it has slowly been improving.  Denies any known trauma to the area.  No known lacerations.  No history of gout.  Still has full sensation and no pain at this time-just swelling.  As stated before, gradually improving over the last 2 weeks.  Additionally, roughly 1 week ago patient awoke from sleep to notice a huge golf ball sized swelling on anterior aspect of proximal forearm overlying extensor tendons.  Once again, unsure of what inciting factor is.  Suspects bug bite but has never seen any bite marks or bugs on him.  Denies any fevers or chills.  Has slowly decreased in size over the last week.  Denies any chest pain or shortness of breath.  Over the last couple days he also developed acute onset left foot pain.  Patient works as a psychologist, occupational and states it has been hard to stand for a prolonged period of time secondary to the pain.  States pain is located proximal to transverse arch on left midfoot.  Works outdoors sometimes but denies any tick bites over the last couple months.  History reviewed. No pertinent past medical history.  There are no active problems to display for this patient.   Past Surgical History:  Procedure Laterality Date   LUNG SURGERY         Home Medications    Prior to Admission medications   Medication Sig Start Date End Date Taking? Authorizing Provider  doxycycline  (VIBRAMYCIN ) 100 MG  capsule Take 1 capsule (100 mg total) by mouth 2 (two) times daily. 03/25/24  Yes Lynwood Barter, DO  meloxicam (MOBIC) 15 MG tablet Take 1 tablet (15 mg total) by mouth daily for 14 days. 03/25/24 04/08/24 Yes Lynwood Barter, DO  Multiple Vitamin (MULITIVITAMIN WITH MINERALS) TABS Take 1 tablet by mouth daily.    [provider]    Family History Family History  Problem Relation Age of Onset   Heart failure Mother    Diabetes Father    Stroke Father     Social History Social History   Tobacco Use   Smoking status: Never   Smokeless tobacco: Never  Vaping Use   Vaping status: Never Used  Substance Use Topics   Alcohol use: No   Drug use: No     Allergies   Other   Review of Systems Review of Systems  ROS negative except as noted in HPI above   Physical Exam Triage Vital Signs ED Triage Vitals  Encounter Vitals Group     BP 03/25/24 1140 (!) 155/100     Girls Systolic BP Percentile --      Girls Diastolic BP Percentile --      Boys Systolic BP Percentile --      Boys Diastolic BP Percentile --      Pulse Rate 03/25/24 1140 92  Resp 03/25/24 1140 16     Temp 03/25/24 1140 98.3 F (36.8 C)     Temp Source 03/25/24 1140 Oral     SpO2 03/25/24 1140 98 %     Weight --      Height --      Head Circumference --      Peak Flow --      Pain Score 03/25/24 1139 6     Pain Loc --      Pain Education --      Exclude from Growth Chart --    No data found.  Updated Vital Signs BP (!) 155/100 (BP Location: Left Arm)   Pulse 92   Temp 98.3 F (36.8 C) (Oral)   Resp 16   SpO2 98%   Visual Acuity Right Eye Distance:   Left Eye Distance:   Bilateral Distance:    Right Eye Near:   Left Eye Near:    Bilateral Near:     Physical Exam Vitals and nursing note reviewed.  Constitutional:      General: He is not in acute distress.    Appearance: He is well-developed. He is not ill-appearing, toxic-appearing or diaphoretic.  HENT:     Head:  Normocephalic and atraumatic.  Eyes:     Conjunctiva/sclera: Conjunctivae normal.  Cardiovascular:     Rate and Rhythm: Normal rate and regular rhythm.     Pulses: Normal pulses.     Heart sounds: Normal heart sounds. No murmur heard. Pulmonary:     Effort: Pulmonary effort is normal. No respiratory distress.     Breath sounds: Normal breath sounds.  Abdominal:     Palpations: Abdomen is soft.     Tenderness: There is no abdominal tenderness.  Musculoskeletal:        General: No swelling.     Right forearm: Swelling (l localized swelling over anterior proximal forearm overlying the extensor tendon origin.  Feels consistent with cyst/abscess but unable to aspirate any fluid from area of swelling) and edema present. No deformity, lacerations, tenderness or bony tenderness.     Left forearm: Normal.     Right hand: Swelling (over entirety of second index finger.  Difficulty with full flexion due to increased pressure of disuse.  Exam more consistent with gout versus pseudogout; does not resemble cellulitis in my experience.) present. Normal capillary refill. Normal pulse.     Left hand: Normal capillary refill. Normal pulse.     Cervical back: Neck supple.  Skin:    General: Skin is warm and dry.     Capillary Refill: Capillary refill takes less than 2 seconds.  Neurological:     Mental Status: He is alert.  Psychiatric:        Mood and Affect: Mood normal.      UC Treatments / Results  Labs (all labs ordered are listed, but only abnormal results are displayed) Labs Reviewed  CBC WITH DIFFERENTIAL/PLATELET  C-REACTIVE PROTEIN  URIC ACID    EKG   Radiology No results found.  Procedures Procedures (including critical care time)  Medications Ordered in UC Medications  lidocaine  (PF) (XYLOCAINE ) 1 % injection 5 mL (5 mLs Other Given 03/25/24 1312)    Initial Impression / Assessment and Plan / UC Course  I have reviewed the triage vital signs and the nursing  notes.  Pertinent labs & imaging results that were available during my care of the patient were reviewed by me and considered in my medical decision making (see  chart for details).    Final Clinical Impressions(s) / UC Diagnoses  #Swelling of right index finger for 2 weeks #Swelling of right anterior proximal forearm overlying extensor tendons for 1 week #Acute onset left foot pain without injury -Attempted aspiration of left forearm but was unable to aspirate any fluid -DDx includes tickborne illness, cellulitis, gout, pseudogout, autoimmune arthropathy versus other - Unfortunately we do not have access to ultrasound at this urgent care and I do not suspect x-ray will be helpful given nature of soft tissue pathology - Will refer patient to sports medicine center for further evaluation and consideration of diagnostic ultrasound - In the meantime I will start patient on empiric antibiotics, doxycycline , to cover for cellulitis or tickborne illness - Patient also takes meloxicam daily for next 7-14 days for pain and anti-inflammatory relief - Patient understands and agrees to treatment plan.  No further questions or concerns at this time.    Final diagnoses:  Swelling of right index finger  Localized swelling of left forearm  Left foot pain     Discharge Instructions      Will get lab work today prior to leaving Follow-up with sports medicine center; ask for Dr. Teressa or one of the fellows  Schedule appointment with sports medicine or sometime later this week if possible Take doxycycline  twice daily with food for the next 10 days Take meloxicam once daily with food every morning for the next 7-14 days While I do not anticipate this, go to ER if you develop any acute worsening of your symptoms or develop new onset fever or chills    ED Prescriptions     Medication Sig Dispense Auth. Provider   meloxicam (MOBIC) 15 MG tablet Take 1 tablet (15 mg total) by mouth daily for 14  days. 14 tablet Lynwood Barter, DO   doxycycline  (VIBRAMYCIN ) 100 MG capsule Take 1 capsule (100 mg total) by mouth 2 (two) times daily. 20 capsule Lynwood Barter, DO      PDMP not reviewed this encounter.   Lynwood Barter, DO 03/25/24 1414

## 2024-03-25 NOTE — Progress Notes (Signed)
 Called pt today and told him labs looks consistent with gout attack. Continue meloxicam daily as needed. New rx sent for allopurinol daiy until you can establish with PCP. No need to pick up doxycycline  from pharmacy as it does not look infective. Patient agreed and understood treatment plan.

## 2024-03-27 ENCOUNTER — Ambulatory Visit (HOSPITAL_COMMUNITY): Payer: Self-pay

## 2024-03-28 ENCOUNTER — Ambulatory Visit: Payer: Self-pay | Admitting: Family Medicine
